# Patient Record
Sex: Female | Born: 1960
Health system: Southern US, Community
[De-identification: ages and names within clinical notes are randomized; demographics above are authoritative.]

## PROBLEM LIST (undated history)

## (undated) DIAGNOSIS — K219 Gastro-esophageal reflux disease without esophagitis: Secondary | ICD-10-CM

## (undated) DIAGNOSIS — E785 Hyperlipidemia, unspecified: Secondary | ICD-10-CM

## (undated) HISTORY — DX: Hyperlipidemia, unspecified: E78.5

## (undated) HISTORY — DX: Gastro-esophageal reflux disease without esophagitis: K21.9

---

## 2009-10-12 ENCOUNTER — Ambulatory Visit: Payer: Self-pay | Admitting: Diagnostic Radiology

## 2009-10-12 ENCOUNTER — Emergency Department (HOSPITAL_BASED_OUTPATIENT_CLINIC_OR_DEPARTMENT_OTHER): Admission: EM | Admit: 2009-10-12 | Discharge: 2009-10-12 | Payer: Self-pay | Admitting: Emergency Medicine

## 2009-10-17 DIAGNOSIS — R079 Chest pain, unspecified: Secondary | ICD-10-CM | POA: Insufficient documentation

## 2009-10-17 DIAGNOSIS — K219 Gastro-esophageal reflux disease without esophagitis: Secondary | ICD-10-CM

## 2009-10-17 DIAGNOSIS — E785 Hyperlipidemia, unspecified: Secondary | ICD-10-CM

## 2009-10-18 ENCOUNTER — Encounter: Payer: Self-pay | Admitting: Cardiology

## 2009-10-18 ENCOUNTER — Ambulatory Visit: Payer: Self-pay | Admitting: Cardiology

## 2009-11-10 ENCOUNTER — Ambulatory Visit: Payer: Self-pay | Admitting: Family

## 2009-11-10 DIAGNOSIS — N63 Unspecified lump in unspecified breast: Secondary | ICD-10-CM

## 2009-11-17 ENCOUNTER — Encounter: Admission: RE | Admit: 2009-11-17 | Discharge: 2009-11-17 | Payer: Self-pay | Admitting: Internal Medicine

## 2010-01-26 ENCOUNTER — Ambulatory Visit: Payer: Self-pay | Admitting: Diagnostic Radiology

## 2010-01-26 ENCOUNTER — Emergency Department (HOSPITAL_BASED_OUTPATIENT_CLINIC_OR_DEPARTMENT_OTHER)
Admission: EM | Admit: 2010-01-26 | Discharge: 2010-01-26 | Payer: Self-pay | Source: Home / Self Care | Admitting: Emergency Medicine

## 2010-05-08 NOTE — Assessment & Plan Note (Signed)
Summary: [facility]   Visit Type:  Initial Consult  CC:  chest pain.  History of Present Illness: 50 year old female for evaluation of chest pain. No prior cardiac history. Seen in the emergency room in early July with chest pain felt most likely to be musculoskeletal. One set of cardiac markers were normal. Hemoglobin, liver functions and chest x-ray normal. Electrocardiogram revealed a normal sinus rhythm with no ST changes. Patient states that she has had intermittent chest pain for 5 years. It can occur with exertion, certain movements or after eating. It radiates to her back. There is no associated nausea or diaphoresis but there is shortness of breath. It lasts all day at times. She was given pain medications and Protonix in the emergency room and these do help her pain. She otherwise denies dyspnea on exertion, orthopnea or increased pedal edema. Because of the above we were asked to further evaluate.  Preventive Screening-Counseling & Management  Alcohol-Tobacco     Smoking Status: never  Current Medications (verified): 1)  Ibuprofen 800 Mg Tabs (Ibuprofen) .... As Needed 2)  Amoxicillin 500 Mg Caps (Amoxicillin) .... Take 1 Cap Every 6 Hours Until Gone  Allergies (verified): No Known Drug Allergies  Past History:  Past Medical History: HYPERLIPIDEMIA  GERD  Past Surgical History: C section  Family History: Reviewed history and no changes required. No premature CAD  Social History: Reviewed history and no changes required. Full Time Married  Tobacco Use - No.  Alcohol Use - no 5 children Smoking Status:  never  Review of Systems       no fevers or chills, productive cough, hemoptysis, dysphasia, odynophagia, melena, hematochezia, dysuria, hematuria, rash, seizure activity, orthopnea, PND, pedal edema, claudication. Remaining systems are negative.   Vital Signs:  Patient profile:   50 year old female Height:      66 inches Weight:      207.50  pounds BMI:     33.61 Pulse rate:   68 / minute Pulse rhythm:   regular Resp:     18 per minute BP sitting:   100 / 60  (left arm) Cuff size:   large  Vitals Entered By: Vikki Ports (October 18, 2009 9:48 AM)  Physical Exam  General:  Well developed/well nourished in NAD Skin warm/dry Patient not depressed No peripheral clubbing Back-normal HEENT-normal/normal eyelids Neck supple/normal carotid upstroke bilaterally; no bruits; no JVD; no thyromegaly chest - CTA/ normal expansion CV - RRR/normal S1 and S2; no murmurs, rubs or gallops;  PMI nondisplaced Abdomen -NT/ND, no HSM, no mass, + bowel sounds, no bruit 2+ femoral pulses, no bruits Ext-trace edema, chords, 2+ DP Neuro-grossly nonfocal     EKG  Procedure date:  10/18/2009  Findings:      Normal sinus rhythm at a rate of 64. No ST changes noted.  Impression & Recommendations:  Problem # 1:  CHEST PAIN (ICD-786.50) Symptoms extremely atypical. Electrocardiogram normal. Chest pain reproduced with palpation. Most likely musculoskeletal. No further cardiac workup indicated. I've asked her to followup with a primary care physician.  Problem # 2:  HYPERLIPIDEMIA (ICD-272.4) Management per primary care.  Problem # 3:  GERD (ICD-530.81) Management per primary care.

## 2010-05-08 NOTE — Assessment & Plan Note (Signed)
Summary: 3169--Rm 4   Vital Signs:  Patient profile:   50 year old female Height:      64.5 inches Weight:      205.75 pounds BMI:     34.90 Temp:     98.0 degrees F oral Pulse rate:   66 / minute Pulse rhythm:   regular Resp:     18 per minute BP sitting:   112 / 76  (right arm) Cuff size:   large  Vitals Entered By: Mervin Kung CMA Duncan Dull) (November 10, 2009 9:07 AM) Is Patient Diabetic? No Comments Pt states she has taken Pantoprazole in the past but it didn't seem to help.  Has tried Hydrocodone but it made her dizzy.  Nicki Guadalajara Fergerson CMA (AAMA)  November 10, 2009 9:15 AM    History of Present Illness: Erica Lindsey is a 50 year old female originally from [place].  Notes that she has lived here for 7 years.  Her primary problem today is chest pain.  She saw Dr. Jens Som last month for atypical chest pain- he did not feel that her pain was cardiac and he recommended that she establish with a PCP.  Patient's history is limited due to language barrier- English is limited.  Patient reports that this pain has been going on for many years.  Pain is made worse by palpation/walking. Pain has been improved by nexium.    Sometimes feels like she can't take a deep breath.  She also notes + back pain.  She has been followed most recently by the [facility] in [facility]- but now has health insurance. She has also been followed at the [facility] in [place].    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   Married  Tobacco Use - No.  Alcohol Use - no 5 children Caffeine use/day:  none Does Patient Exercise:  no  Review of Systems       Constitutional: Denies Fever ENT:  Denies nasal congestion or sore throat. Resp: Denies cough CV:  see HPI GI:  Denies nausea or vomitting GU: Denies dysuria Lymphatic: Denies lymphadenopathy Musculoskeletal:  notes + flat feet- hurt at times.   Skin:  Denies Rashes Psychiatric: Denies depression Neuro: Denies numbness     Physical Exam  General:  Well-developed,well-nourished,in no acute distress; alert,appropriate and cooperative throughout examination Head:  Normocephalic and atraumatic without obvious abnormalities. No apparent alopecia or balding. Breasts:  + approximately 1" diameter mobile mass noted on left breast at 11 oclock.  Tender to palpation.   Lungs:  Normal respiratory effort, chest expands symmetrically. Lungs are clear to auscultation, no crackles or wheezes. Heart:  Normal rate and regular rhythm. S1 and S2 normal without gallop, murmur, click, rub or other extra sounds. Extremities:  No clubbing, cyanosis, edema, or deformity noted with normal full range of motion of all joints.     Impression & Recommendations:  Problem # 1:  BREAST MASS, LEFT (ICD-611.72) Assessment New Suspect that this is the etiology of her chest tenderenss.  ?  breast cyst.  Will refer for diagnostic mammo and breast ultrasound. Orders: Ultrasound (Ultrasound) Mammogram (Diagnostic) (Mammo)  Problem # 2:  GERD (ICD-530.81) Assessment: Improved Improved, continue nexium. Her updated medication list for this problem includes:    Nexium 40 Mg Cpdr (Esomeprazole magnesium) .Marland Kitchen... Take 1 capsule by mouth once a day  Complete Medication List: 1)  Nexium 40 Mg Cpdr (Esomeprazole magnesium) .... Take 1 capsule by mouth once a day 2)  Crestor 20 Mg Tabs (Rosuvastatin calcium) .... Take 1 tablet by mouth once a day 3)  Tylenol Extra  Strength 500 Mg Tabs (Acetaminophen) .... 2 tabs by mouth every 6 hours as needed for breast pain  Patient Instructions: 1)  Please arrange a follow up appointment for a complete physical- Come fasting to this appointment- nothing to eat or drink. 2)  Complete your breast ultrasound and mammogram downstairs on the first floor (imaging department). We will call you with your results. Prescriptions: CRESTOR 20 MG TABS (ROSUVASTATIN CALCIUM) Take 1 tablet by mouth once a day  #30 x 2   Entered and Authorized by:   Lemont Fillers FNP   Signed by:   Lemont Fillers FNP on 11/10/2009   Method used:   Electronically to        [facility] # [phone]* (retail)       [address]       [place], [place]  [address]       Ph: [phone]       Fax: [phone]   RxID:   [id number] NEXIUM 40 MG CPDR (ESOMEPRAZOLE MAGNESIUM) Take 1 capsule by mouth once a day  #30 x 5   Entered and Authorized by:   Lemont Fillers FNP   Signed by:   Lemont Fillers FNP on 11/10/2009   Method used:   Electronically to        [facility] # [phone]* (retail)       [address]       [place], [place]  [address]       Ph: [phone]       Fax: [phone]   RxID:   [id number]   Current Allergies (reviewed today): No known allergies        Vital Signs:  Patient Profile:   50 year old female Height:     64.5 inches Weight:      205.75 pounds BMI:     34.90 Temp:     98.0 degrees F oral Pulse rate:   66 / minute Pulse rhythm:   regular Resp:     18 per minute BP sitting:   112 / 76 Cuff size:   large

## 2010-06-20 LAB — CBC
HCT: 38.3 % (ref 36.0–46.0)
Hemoglobin: 12.6 g/dL (ref 12.0–15.0)
MCH: 28.9 pg (ref 26.0–34.0)
MCHC: 32.9 g/dL (ref 30.0–36.0)
MCV: 87.9 fL (ref 78.0–100.0)

## 2010-06-20 LAB — DIFFERENTIAL
Eosinophils Absolute: 0.2 10*3/uL (ref 0.0–0.7)
Lymphs Abs: 3.1 10*3/uL (ref 0.7–4.0)
Neutrophils Relative %: 53 % (ref 43–77)

## 2010-06-20 LAB — COMPREHENSIVE METABOLIC PANEL
ALT: 18 U/L (ref 0–35)
CO2: 30 mEq/L (ref 19–32)
Calcium: 9.7 mg/dL (ref 8.4–10.5)
Creatinine, Ser: 0.7 mg/dL (ref 0.4–1.2)
GFR calc non Af Amer: >60 mL/min (ref >60)
Glucose, Bld: 88 mg/dL (ref 70–99)
Sodium: 145 mEq/L (ref 135–145)

## 2010-06-20 LAB — POCT CARDIAC MARKERS
CKMB, poc: <1 ng/mL — ABNORMAL LOW (ref 1.0–8.0)
CKMB, poc: <1 ng/mL — ABNORMAL LOW (ref 1.0–8.0)
Myoglobin, poc: 35.6 ng/mL (ref 12–200)
Troponin i, poc: <0.05 ng/mL (ref 0.00–0.09)

## 2010-06-20 LAB — URINALYSIS, ROUTINE W REFLEX MICROSCOPIC
Bilirubin Urine: NEGATIVE
Ketones, ur: NEGATIVE mg/dL
Nitrite: NEGATIVE
Protein, ur: NEGATIVE mg/dL
Specific Gravity, Urine: 1.011 (ref 1.005–1.030)
Urobilinogen, UA: 0.2 mg/dL (ref 0.0–1.0)

## 2010-06-20 LAB — LIPASE, BLOOD: Lipase: 101 U/L (ref 23–300)

## 2010-06-20 LAB — PREGNANCY, URINE: Preg Test, Ur: NEGATIVE

## 2010-06-24 LAB — COMPREHENSIVE METABOLIC PANEL
AST: 30 U/L (ref 0–37)
Albumin: 4.5 g/dL (ref 3.5–5.2)
Calcium: 9.1 mg/dL (ref 8.4–10.5)
Creatinine, Ser: 0.7 mg/dL (ref 0.4–1.2)
GFR calc Af Amer: >60 mL/min (ref >60)
GFR calc non Af Amer: >60 mL/min (ref >60)

## 2010-06-24 LAB — DIFFERENTIAL
Eosinophils Relative: 2 % (ref 0–5)
Lymphocytes Relative: 45 % (ref 12–46)
Lymphs Abs: 3.5 10*3/uL (ref 0.7–4.0)
Monocytes Absolute: 0.4 10*3/uL (ref 0.1–1.0)
Neutro Abs: 3.7 10*3/uL (ref 1.7–7.7)

## 2010-06-24 LAB — CBC
MCH: 28.8 pg (ref 26.0–34.0)
MCHC: 33.4 g/dL (ref 30.0–36.0)
Platelets: 192 10*3/uL (ref 150–400)

## 2010-06-24 LAB — POCT CARDIAC MARKERS
CKMB, poc: <1 ng/mL — ABNORMAL LOW (ref 1.0–8.0)
Troponin i, poc: <0.05 ng/mL (ref 0.00–0.09)

## 2010-11-19 ENCOUNTER — Encounter: Payer: Self-pay | Admitting: Cardiology

## 2011-11-07 IMAGING — CR DG CHEST 2V
2 series · 2 of 2 positions shown · non-contrast
Comparison: 10/12/2009

CLINICAL DATA: Chest pain

CHEST - 2 VIEW

[w chest pa]
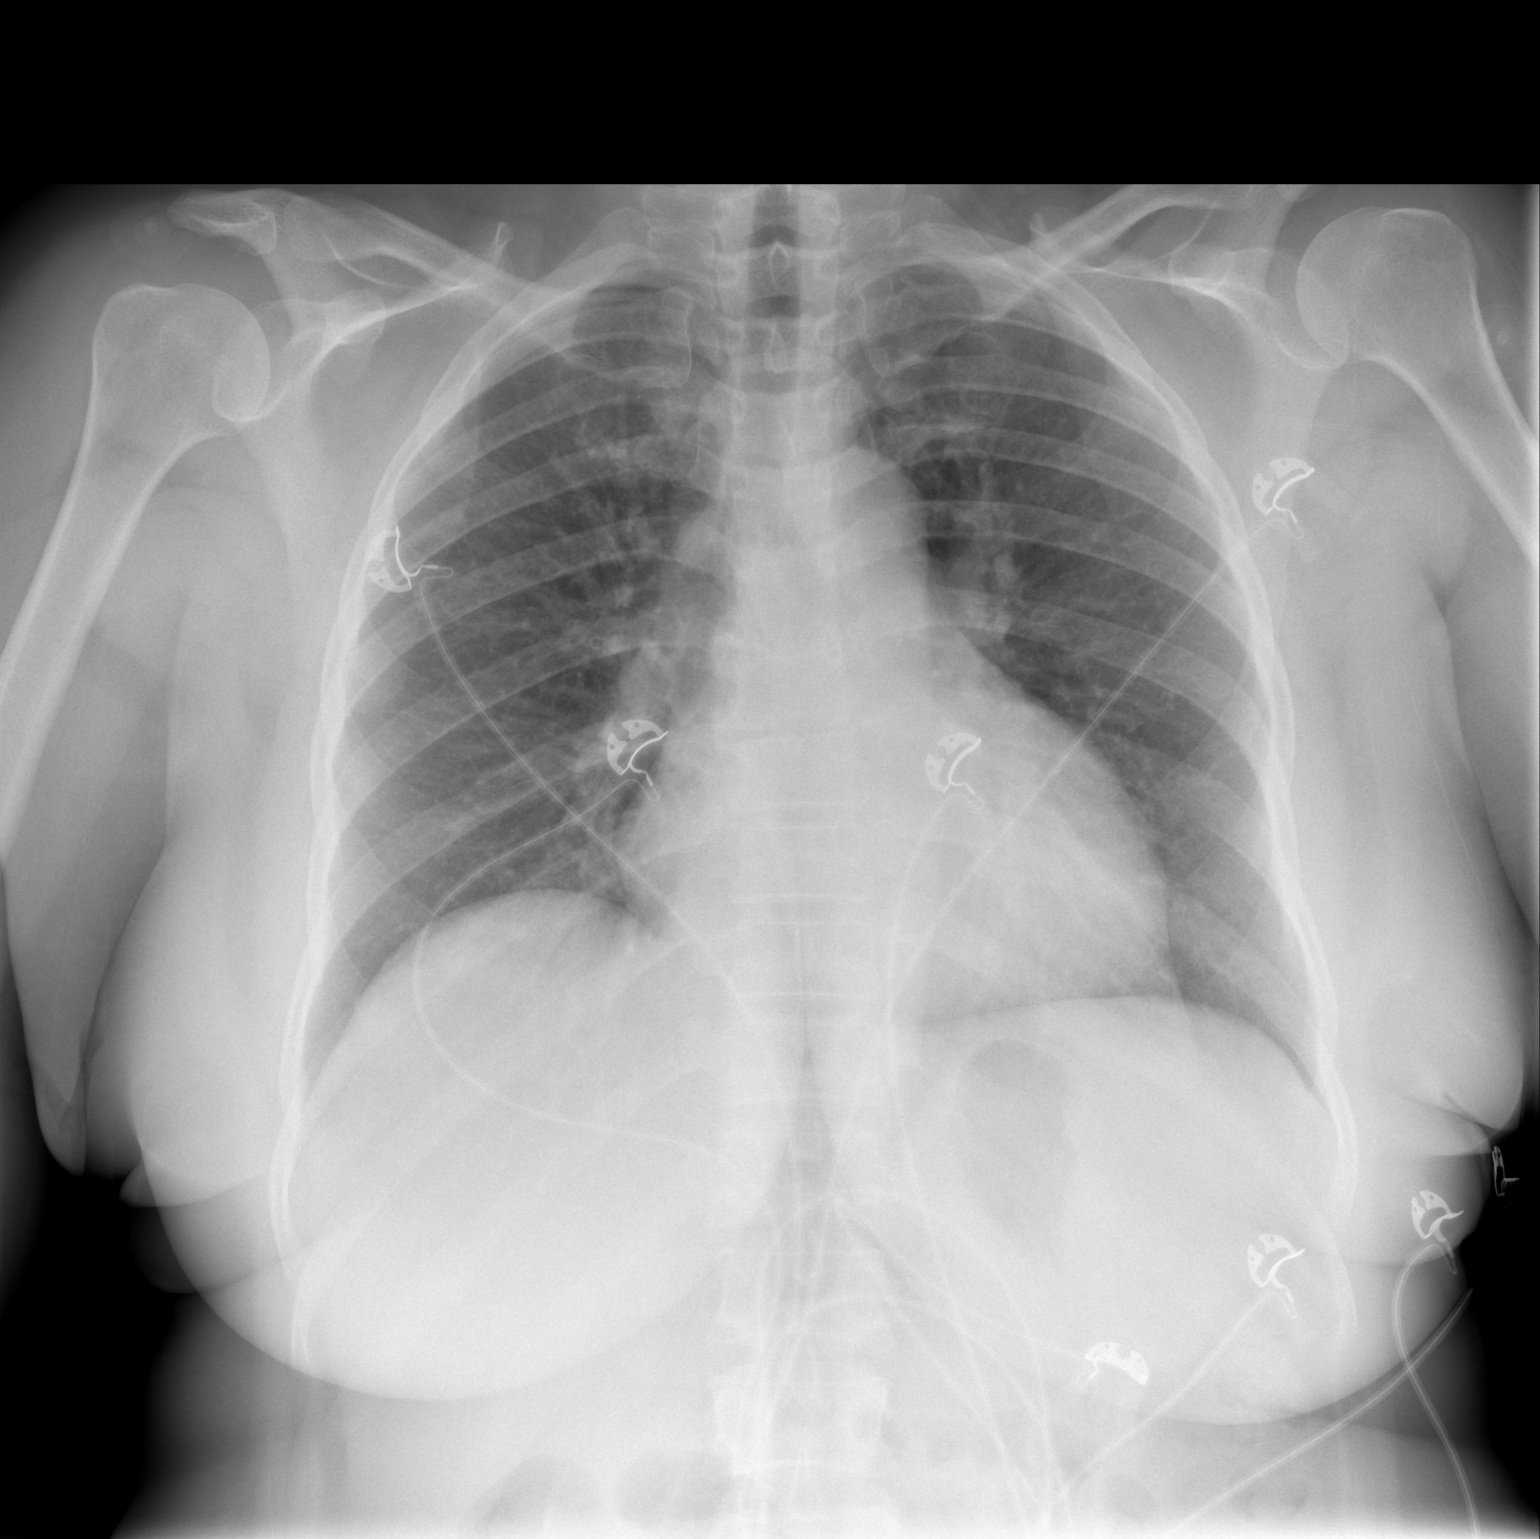

[w chest lat]
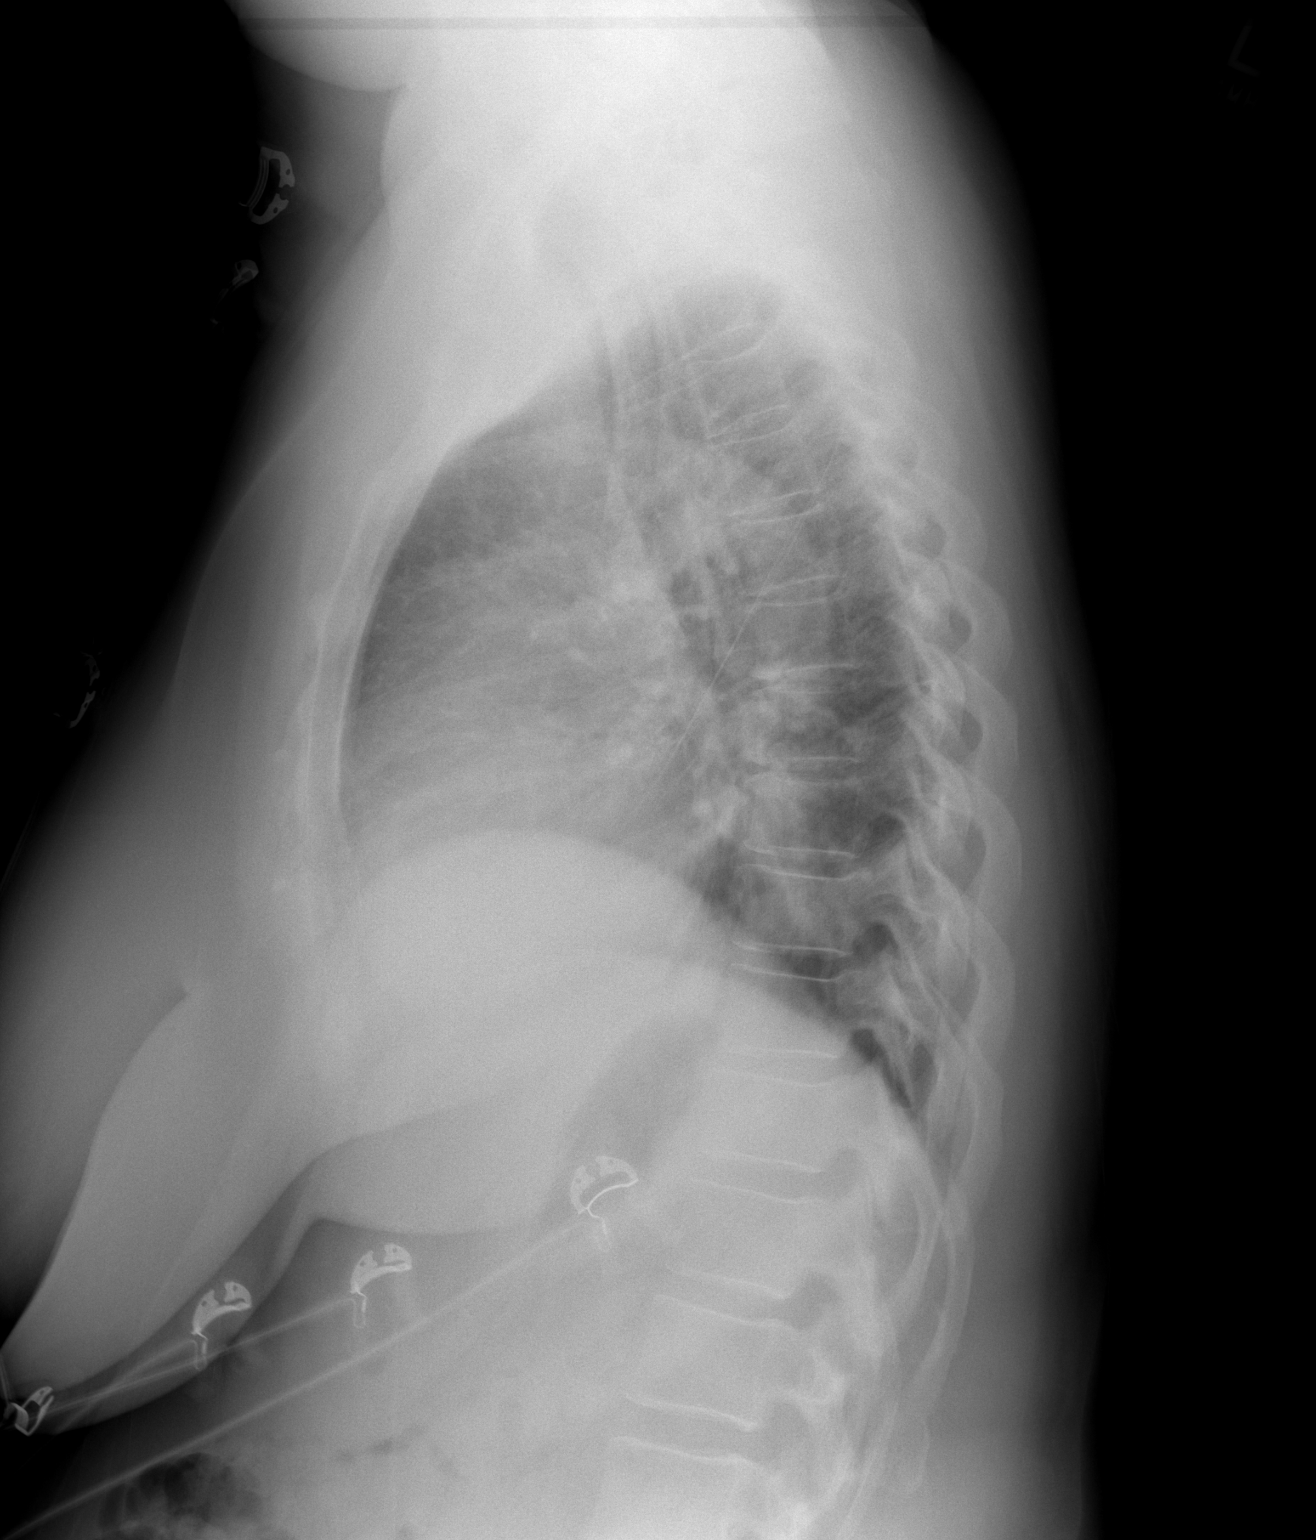

[2 of 2 positions shown; findings below may reference images not displayed]

FINDINGS: The lungs are low volume but clear bilaterally.  No
confluent airspace opacities, pleural effuions or pneumothoracies
are seen.  The heart is normal in size and contour.  The upper
abdomen and osseous structures are normal.
IMPRESSION: No acute cardiopulmonary disease.

## 2020-06-18 ENCOUNTER — Encounter (HOSPITAL_BASED_OUTPATIENT_CLINIC_OR_DEPARTMENT_OTHER): Payer: Self-pay

## 2020-06-18 ENCOUNTER — Emergency Department (HOSPITAL_BASED_OUTPATIENT_CLINIC_OR_DEPARTMENT_OTHER)
Admission: EM | Admit: 2020-06-18 | Discharge: 2020-06-18 | Disposition: A | Discharge status: Home / Self Care | Payer: 59 | Attending: Emergency Medicine | Admitting: Emergency Medicine

## 2020-06-18 ENCOUNTER — Emergency Department (HOSPITAL_BASED_OUTPATIENT_CLINIC_OR_DEPARTMENT_OTHER): Payer: 59

## 2020-06-18 ENCOUNTER — Other Ambulatory Visit: Payer: Self-pay

## 2020-06-18 DIAGNOSIS — X500XXA Overexertion from strenuous movement or load, initial encounter: Secondary | ICD-10-CM | POA: Diagnosis not present

## 2020-06-18 DIAGNOSIS — M25511 Pain in right shoulder: Secondary | ICD-10-CM | POA: Insufficient documentation

## 2020-06-18 MED ORDER — OXYCODONE-ACETAMINOPHEN 5-325 MG PO TABS: 1.0000 | ORAL_TABLET | Freq: Three times a day (TID) | ORAL | 0 refills | Status: DC | PRN

## 2020-06-18 MED ORDER — HYDROCODONE-ACETAMINOPHEN 5-325 MG PO TABS
1.0000 | ORAL_TABLET | Freq: Once | ORAL | Status: AC
  Administered 2020-06-18: 1 via ORAL
  Filled 2020-06-18: qty 1

## 2020-06-18 NOTE — ED Provider Notes (Signed)
[facility] EMERGENCY DEPARTMENT Provider Note   CSN: [id number] Arrival date & time: 06/18/20  1027     History Chief Complaint  Patient presents with  . Shoulder Pain    Erica Lindsey is a 60 y.o. female.  HPI Patient is a 60 year old female who presents the emergency department due to right shoulder pain.  Her symptoms started about 2 weeks ago.  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  She states she woke up in the morning and was experiencing her symptoms.  Denies any known injury or falls.  No numbness or tingling.  She does report some mild weakness secondary to her pain.  No other complaints at this time.    Past Medical History:  Diagnosis Date  . GERD (gastroesophageal reflux disease)   . HLD (hyperlipidemia)     Patient Active Problem List   Diagnosis Date Noted  . BREAST MASS, LEFT 11/10/2009  . HYPERLIPIDEMIA 10/17/2009  . GERD 10/17/2009  . CHEST PAIN 10/17/2009    Past Surgical History:  Procedure Laterality Date  . BLADDER REPAIR W/ CESAREAN SECTION       OB History   No obstetric history on file.     Family History  Problem Relation Age of Onset  . Hypertension Mother   . Diabetes Mother   . Stroke Father   . Heart attack Father     Social History   Tobacco Use  . Smoking status: Never Smoker  . Smokeless tobacco: Never Used  . Tobacco comment: tobacco use- no   Substance Use Topics  . Alcohol use: No  . Drug use: Never    Home Medications Prior to Admission medications   Medication Sig Start Date End Date Taking? Authorizing Provider  oxyCODONE-acetaminophen (PERCOCET/ROXICET) 5-325 MG tablet Take 1 tablet by mouth every 8 (eight) hours as needed for severe pain. 06/18/20  Yes Placido Sou, PA-C  acetaminophen (TYLENOL) 500 MG tablet Take 1,000 mg by mouth every 6 (six) hours as needed.      [provider]  esomeprazole (NEXIUM) 40 MG capsule Take 40 mg by mouth daily.       [provider]  rosuvastatin (CRESTOR) 20 MG tablet Take 20 mg by mouth daily.      [provider]    Allergies    Patient has no known allergies.  Review of Systems   Review of Systems  Gastrointestinal: Negative for abdominal pain.  Musculoskeletal: Positive for arthralgias and myalgias. Negative for joint swelling.  Skin: Negative for color change and wound.  Neurological: Positive for weakness. Negative for numbness.   Physical Exam Updated Vital Signs BP 134/82 (BP Location: Right Arm)   Pulse 70   Temp 98.3 F (36.8 C) (Oral)   Resp 16   Ht 5\' 4"  (1.626 m)   Wt 87.5 kg   SpO2 99%   BMI 33.13 kg/m   Physical Exam Vitals and nursing note reviewed.  Constitutional:      General: She is not in acute distress.    Appearance: She is well-developed.  HENT:     Head: Normocephalic and atraumatic.     Right Ear: External ear normal.     Left Ear: External ear normal.  Eyes:     General: No scleral icterus.       Right eye: No discharge.        Left eye: No discharge.     Conjunctiva/sclera: Conjunctivae normal.  Neck:  Trachea: No tracheal deviation.  Cardiovascular:     Rate and Rhythm: Normal rate.  Pulmonary:     Effort: Pulmonary effort is normal. No respiratory distress.     Breath sounds: No stridor.  Abdominal:     General: Abdomen is flat. There is no distension.     Palpations: Abdomen is soft.     Tenderness: There is no abdominal tenderness.  Musculoskeletal:        General: Tenderness present. No swelling or deformity.     Cervical back: Neck supple.     Comments: Moderate tenderness noted circumferentially around the right shoulder.  Difficult to assess patient's range of motion secondary to pain.  She does have full passive range of motion.  Negative Hawkins sign.  Positive cross body abduction test with the right arm.  Strength is 5 out of 5 in both upper extremities.  Grip strength is intact.  Distal sensation is intact.  2+  radial pulses.  Skin:    General: Skin is warm and dry.     Findings: No rash.  Neurological:     Mental Status: She is alert.     Cranial Nerves: Cranial nerve deficit: no gross deficits.     ED Results / Procedures / Treatments   Labs (all labs ordered are listed, but only abnormal results are displayed) Labs Reviewed - No data to display  EKG None  Radiology DG Shoulder Right  Result Date: 06/18/2020 CLINICAL DATA:  Right shoulder pain.  No known injury. EXAM: RIGHT SHOULDER - 2+ VIEW COMPARISON:  None. FINDINGS: No fracture dislocation. No degenerative changes. No acute abnormalities identified. IMPRESSION: Negative. Electronically Signed   By: Gerome Sam III M.D   On: 06/18/2020 11:14   Procedures Procedures   Medications Ordered in ED Medications  HYDROcodone-acetaminophen (NORCO/VICODIN) 5-325 MG per tablet 1 tablet (1 tablet Oral Given 06/18/20 1058)    ED Course  I have reviewed the triage vital signs and the nursing notes.  Pertinent labs & imaging results that were available during my care of the patient were reviewed by me and considered in my medical decision making (see chart for details).  Clinical Course as of 06/18/20 1131  Sun Jun 18, 2020  1120 DG Shoulder Right Negative. [LJ]    Clinical Course User Index [LJ] Jannet Mantis   MDM Rules/Calculators/A&P                          Patient is a 60 year old female who presents the emergency department due to right shoulder pain.  Started about 2 weeks ago.  Patient does a significant amount of heavy lifting through her job.  Physical exam is generally reassuring.  She does have moderate tenderness circumferentially around the right shoulder but range of motion and strength appears to be intact.  She had a negative Hawkins sign but a positive cross body abduction test.  Symptoms seem to be muscular in nature.  No focal bony tenderness.  She is neurovascularly intact in the upper extremities.  No  red flags.  She was given a dose of Vicodin in the emergency department and notes significant relief of her pain.  Her husband is going to drive her home.  Will discharge on a short course of Percocet.  We discussed safety regarding this medication.  She was given sports medicine follow-up.  Discussed return precautions.  Her questions were answered and she was amicable at the time of discharge.  Final Clinical Impression(s) / ED Diagnoses Final diagnoses:  Acute pain of right shoulder   Rx / DC Orders ED Discharge Orders         Ordered    oxyCODONE-acetaminophen (PERCOCET/ROXICET) 5-325 MG tablet  Every 8 hours PRN        06/18/20 1126           Placido Sou, PA-C 06/18/20 1131    [place], MD 06/21/20 [phone]

## 2020-06-18 NOTE — Discharge Instructions (Signed)
Like we discussed, I am going to prescribe you a strong medication called Percocet.  I would recommend you continue taking Tylenol during the day for your regular pain.  If you have severe breakthrough pain, you can then take 1 Percocet.  Only take this as prescribed.  Do not mix with alcohol.  Do not operate a motor vehicle after taking it.  Percocet also contains Tylenol, so please be sure that you are not exceeding 3000 mg of Tylenol in 1 day.  Below is the contact information for Dr. Jordan Likes.  He has a local sports medicine doctor.  His office is located in the same building as this emergency department.  Please give him a call and schedule a follow-up appointment.  If your symptoms worsen, you can always return to the emergency department.  It was a pleasure to meet you.

## 2020-06-18 NOTE — ED Notes (Signed)
Patient transported to X-ray 

## 2020-06-18 NOTE — ED Triage Notes (Signed)
Pt arrive with right sided shoulder pain and arm pain reports she thinks it may be from lifting something heavy, pt has been using tylenol at home but reports this only works for about 2 hours.

## 2020-06-18 NOTE — ED Notes (Signed)
Pt presents with 2 weeks right shoulder pain. Pt lifts furniture during work day. Pt is very sensitive to being touched right shoulder . Pt has limited ROM due to pain. Pt s arm is able to be lifted and ROM by MD. Pulses are moderate, skin is appropriate for ethnicity. Si Gaul

## 2020-06-19 ENCOUNTER — Encounter (HOSPITAL_BASED_OUTPATIENT_CLINIC_OR_DEPARTMENT_OTHER): Payer: Self-pay

## 2020-06-29 ENCOUNTER — Ambulatory Visit (INDEPENDENT_AMBULATORY_CARE_PROVIDER_SITE_OTHER): Payer: 59 | Admitting: Family Medicine

## 2020-06-29 ENCOUNTER — Ambulatory Visit: Payer: Self-pay

## 2020-06-29 ENCOUNTER — Other Ambulatory Visit: Payer: Self-pay

## 2020-06-29 VITALS — BP 120/76 | Ht 64.0 in | Wt 193.0 lb

## 2020-06-29 DIAGNOSIS — M25411 Effusion, right shoulder: Secondary | ICD-10-CM

## 2020-06-29 DIAGNOSIS — M1711 Unilateral primary osteoarthritis, right knee: Secondary | ICD-10-CM | POA: Insufficient documentation

## 2020-06-29 DIAGNOSIS — M25511 Pain in right shoulder: Secondary | ICD-10-CM

## 2020-06-29 DIAGNOSIS — S43439A Superior glenoid labrum lesion of unspecified shoulder, initial encounter: Secondary | ICD-10-CM | POA: Insufficient documentation

## 2020-06-29 MED ORDER — PREDNISONE 5 MG PO TABS: ORAL_TABLET | ORAL | 0 refills | Status: DC

## 2020-06-29 NOTE — Patient Instructions (Signed)
Nice to meet you Please try the medicine  Please try the exercises   Please send me a message in MyChart with any questions or updates.  Please see me back in 3 weeks.   --Dr. Jordan Likes

## 2020-06-29 NOTE — Assessment & Plan Note (Signed)
There appears to be an effusion within the joint as the source of most of her pain.  Does have findings suggestive of impingement as well. -Counseled on home exercise therapy and supportive care. -Prednisone. -Could consider injection or physical therapy.

## 2020-06-29 NOTE — Progress Notes (Signed)
  Erica Lindsey - 60 y.o. female MRN [id number]  Date of birth: 1961/03/06  SUBJECTIVE:  Including CC & ROS.  No chief complaint on file.   Erica Lindsey is a 60 y.o. female that is presenting with right shoulder pain.  Pain has been ongoing for the past 3 weeks.  Denies any injury or inciting event.  Has tried over-the-counter medications with limited improvement.  No prior history of surgery..  Independent review of the right shoulder x-ray from 2/13 shows no acute changes.  Interview was conducted with the telephone [place] interpreter  Review of Systems See HPI   HISTORY: Past Medical, Surgical, Social, and Family History Reviewed & Updated per EMR.   Pertinent Historical Findings include:  Past Medical History:  Diagnosis Date  . GERD (gastroesophageal reflux disease)   . HLD (hyperlipidemia)     Past Surgical History:  Procedure Laterality Date  . BLADDER REPAIR W/ CESAREAN SECTION      Family History  Problem Relation Age of Onset  . Hypertension Mother   . Diabetes Mother   . Stroke Father   . Heart attack Father     Social History   Socioeconomic History  . Marital status: Married    Spouse name: Not on file  . Number of children: Not on file  . Years of education: Not on file  . Highest education level: Not on file  Occupational History  . Not on file  Tobacco Use  . Smoking status: Never Smoker  . Smokeless tobacco: Never Used  . Tobacco comment: tobacco use- no   Substance and Sexual Activity  . Alcohol use: No  . Drug use: Never  . Sexual activity: Not on file  Other Topics Concern  . Not on file  Social History Narrative   ** Merged History Encounter **       Social Determinants of Health   Financial Resource Strain: Not on file  Food Insecurity: Not on file  Transportation Needs: Not on file  Physical Activity: Not on file  Stress: Not on file  Social Connections: Not on file  Intimate Partner Violence: Not on file      PHYSICAL EXAM:  VS: BP 120/76 (BP Location: Left Arm, Patient Position: Sitting, Cuff Size: Large)   Ht 5\' 4"  (1.626 m)   Wt 193 lb (87.5 kg)   BMI 33.13 kg/m  Physical Exam Gen: NAD, alert, cooperative with exam, well-appearing MSK:  Right shoulder: Normal range of motion. Normal strength resistance. Positive empty can test. Neurovascular intact  Limited ultrasound: Right shoulder: There is an encircling effusion of the biceps tendon. No changes of the subscapularis. Does have hyperemia at the acromial tip.  Supraspinatus appears to be normal. Effusion noted in the posterior glenohumeral joint.:  Summary: Effusion appears to be in the joint.  Ultrasound and interpretation by , MD    ASSESSMENT & PLAN:   Effusion of joint of right shoulder There appears to be an effusion within the joint as the source of most of her pain.  Does have findings suggestive of impingement as well. -Counseled on home exercise therapy and supportive care. -Prednisone. -Could consider injection or physical therapy.  Primary osteoarthritis of right knee Has had previous imaging demonstrating mild medial joint space narrowing.  Has received injections in the past. -Counseled on home exercise therapy and supportive care. -Prednisone. -Could consider further imaging, physical therapy or gel injections.

## 2020-06-29 NOTE — Assessment & Plan Note (Signed)
Has had previous imaging demonstrating mild medial joint space narrowing.  Has received injections in the past. -Counseled on home exercise therapy and supportive care. -Prednisone. -Could consider further imaging, physical therapy or gel injections.

## 2020-07-20 ENCOUNTER — Ambulatory Visit (INDEPENDENT_AMBULATORY_CARE_PROVIDER_SITE_OTHER): Payer: 59 | Admitting: Family Medicine

## 2020-07-20 ENCOUNTER — Encounter: Payer: Self-pay | Admitting: Family Medicine

## 2020-07-20 ENCOUNTER — Other Ambulatory Visit: Payer: Self-pay

## 2020-07-20 ENCOUNTER — Ambulatory Visit: Payer: Self-pay

## 2020-07-20 DIAGNOSIS — M25411 Effusion, right shoulder: Secondary | ICD-10-CM

## 2020-07-20 MED ORDER — FLUTICASONE PROPIONATE 50 MCG/ACT NA SUSP: 2.0000 | Freq: Every day | NASAL | 3 refills | Status: AC

## 2020-07-20 MED ORDER — CETIRIZINE HCL 10 MG PO TABS: 10.0000 mg | ORAL_TABLET | Freq: Every day | ORAL | 11 refills | Status: AC

## 2020-07-20 MED ORDER — TRIAMCINOLONE ACETONIDE 40 MG/ML IJ SUSP
40.0000 mg | Freq: Once | INTRAMUSCULAR | Status: AC
  Administered 2020-07-20: 40 mg via INTRA_ARTICULAR

## 2020-07-20 NOTE — Progress Notes (Signed)
Erica Lindsey - 60 y.o. female MRN [id number]  Date of birth: 07/17/1960  SUBJECTIVE:  Including CC & ROS.  No chief complaint on file.   Erica Lindsey is a 60 y.o. female that is  Following up for her right shoulder pain. Pain is still occurring. Localized to the shoulder.    Review of Systems See HPI   HISTORY: Past Medical, Surgical, Social, and Family History Reviewed & Updated per EMR.   Pertinent Historical Findings include:  Past Medical History:  Diagnosis Date  . GERD (gastroesophageal reflux disease)   . HLD (hyperlipidemia)     Past Surgical History:  Procedure Laterality Date  . BLADDER REPAIR W/ CESAREAN SECTION      Family History  Problem Relation Age of Onset  . Hypertension Mother   . Diabetes Mother   . Stroke Father   . Heart attack Father     Social History   Socioeconomic History  . Marital status: Married    Spouse name: Not on file  . Number of children: Not on file  . Years of education: Not on file  . Highest education level: Not on file  Occupational History  . Not on file  Tobacco Use  . Smoking status: Never Smoker  . Smokeless tobacco: Never Used  . Tobacco comment: tobacco use- no   Substance and Sexual Activity  . Alcohol use: No  . Drug use: Never  . Sexual activity: Not on file  Other Topics Concern  . Not on file  Social History Narrative   ** Merged History Encounter **       Social Determinants of Health   Financial Resource Strain: Not on file  Food Insecurity: Not on file  Transportation Needs: Not on file  Physical Activity: Not on file  Stress: Not on file  Social Connections: Not on file  Intimate Partner Violence: Not on file     PHYSICAL EXAM:  VS: BP 120/82 (BP Location: Left Arm, Patient Position: Sitting, Cuff Size: Large)   Ht 5\' 4"  (1.626 m)   Wt 193 lb (87.5 kg)   BMI 33.13 kg/m  Physical Exam Gen: NAD, alert, cooperative with exam, well-appearing MSK:  Right shoulder:  Normal  range of motion of the Normal strength resistance Family external rotation. Pain with empty can testing. Neurovascularly intact   Aspiration/Injection Procedure Note Erica Lindsey 15-Feb-1961  Procedure: Injection Indications: Right shoulder pain   Procedure Details Consent: Risks of procedure as well as the alternatives and risks of each were explained to the (patient/caregiver).  Consent for procedure obtained. Time Out: Verified patient identification, verified procedure, site/side was marked, verified correct patient position, special equipment/implants available, medications/allergies/relevent history reviewed, required imaging and test results available.  Performed.  The area was cleaned with iodine and alcohol swabs.    The right glenohumeral joint was injected using 3 cc of 1% lidocaine on a 22-gauge 3-1/2 inch needle.  The syringe was switched to mixture containing 1 cc's of 40 mg Kenalog and 4 cc's of 0.25% bupivacaine was injected.  Ultrasound was used. Images were obtained in long views showing the injection.     A sterile dressing was applied.  Patient did tolerate procedure well.     ASSESSMENT & PLAN:   Effusion of joint of right shoulder Acute worsening.  Did get mild improvement with the prednisone but the pain has returned.  Concern for possible labral tear given the duration and findings on ultrasound. -Counseled  on home exercise therapy and supportive care. -Injection. -Could consider physical therapy for further imaging.

## 2020-07-20 NOTE — Assessment & Plan Note (Addendum)
Acute worsening.  Did get mild improvement with the prednisone but the pain has returned.  Concern for possible labral tear given the duration and findings on ultrasound. -Counseled on home exercise therapy and supportive care. -Injection. -Could consider physical therapy for further imaging.

## 2020-07-20 NOTE — Patient Instructions (Signed)
Good to see you Please try ice  Please continue the exercises   Please send me a message in MyChart with any questions or updates.  Please see me back in 4 weeks.   --Dr. Zigmond Trela  

## 2020-07-27 ENCOUNTER — Ambulatory Visit (INDEPENDENT_AMBULATORY_CARE_PROVIDER_SITE_OTHER): Payer: 59 | Admitting: Family Medicine

## 2020-07-27 ENCOUNTER — Other Ambulatory Visit: Payer: Self-pay

## 2020-07-27 DIAGNOSIS — S43431D Superior glenoid labrum lesion of right shoulder, subsequent encounter: Secondary | ICD-10-CM

## 2020-07-27 MED ORDER — HYDROCODONE-ACETAMINOPHEN 5-325 MG PO TABS: 1.0000 | ORAL_TABLET | Freq: Three times a day (TID) | ORAL | 0 refills | Status: DC | PRN

## 2020-07-27 MED ORDER — MELOXICAM 7.5 MG PO TABS: 7.5000 mg | ORAL_TABLET | Freq: Two times a day (BID) | ORAL | 1 refills | Status: DC | PRN

## 2020-07-27 NOTE — Progress Notes (Signed)
  Erica Lindsey - 60 y.o. female MRN [id number]  Date of birth: 1961/01/28  SUBJECTIVE:  Including CC & ROS.  No chief complaint on file.   Erica Lindsey is a 60 y.o. female that is presenting with worsening of her right shoulder pain.  The pain has been ongoing since around January.  She does work for herself.  She is having pain with any abduction or flexion.  She has tried medications, home therapy for over 6 weeks and cortisone injection.   Review of Systems See HPI   HISTORY: Past Medical, Surgical, Social, and Family History Reviewed & Updated per EMR.   Pertinent Historical Findings include:  Past Medical History:  Diagnosis Date  . GERD (gastroesophageal reflux disease)   . HLD (hyperlipidemia)     Past Surgical History:  Procedure Laterality Date  . BLADDER REPAIR W/ CESAREAN SECTION      Family History  Problem Relation Age of Onset  . Hypertension Mother   . Diabetes Mother   . Stroke Father   . Heart attack Father     Social History   Socioeconomic History  . Marital status: Married    Spouse name: Not on file  . Number of children: Not on file  . Years of education: Not on file  . Highest education level: Not on file  Occupational History  . Not on file  Tobacco Use  . Smoking status: Never Smoker  . Smokeless tobacco: Never Used  . Tobacco comment: tobacco use- no   Substance and Sexual Activity  . Alcohol use: No  . Drug use: Never  . Sexual activity: Not on file  Other Topics Concern  . Not on file  Social History Narrative   ** Merged History Encounter **       Social Determinants of Health   Financial Resource Strain: Not on file  Food Insecurity: Not on file  Transportation Needs: Not on file  Physical Activity: Not on file  Stress: Not on file  Social Connections: Not on file  Intimate Partner Violence: Not on file     PHYSICAL EXAM:  VS: BP 120/70   Ht 5\' 4"  (1.626 m)   Wt 210 lb (95.3 kg)   BMI 36.05 kg/m   Physical Exam Gen: NAD, alert, cooperative with exam, well-appearing MSK:  Right shoulder: Normal range of motion. Normal strength resistance. Normal empty can test. Positive speeds test. Positive O'Brien's test. Neurovascular intact     ASSESSMENT & PLAN:   Labral tear of shoulder Acutely worsening.  Concern for labral tear given effusion seen on previous ultrasound of the bicipital sheath. -Counseled on home exercise therapy and supportive care. -Mobic. -Norco. -Referral to physical therapy. -MRI to evaluate for labral tear

## 2020-07-27 NOTE — Patient Instructions (Signed)
Good to see you Please try ice as needed  Please try physical therapy  Please call [phone] to schedule the mri   Please take the pain medication (norco) as needed. Please take with a fiber supplement.  Please send me a message in MyChart with any questions or updates.  We will setup a virtual visit once the MRI is resulted.   --Dr. Jordan Likes

## 2020-07-27 NOTE — Assessment & Plan Note (Addendum)
Acutely worsening.  Concern for labral tear given effusion seen on previous ultrasound of the bicipital sheath. -Counseled on home exercise therapy and supportive care. -Mobic. -Norco. -Referral to physical therapy. -MRI to evaluate for labral tear

## 2020-08-01 ENCOUNTER — Encounter: Payer: Self-pay | Admitting: Physical Therapy

## 2020-08-01 ENCOUNTER — Other Ambulatory Visit: Payer: Self-pay

## 2020-08-01 ENCOUNTER — Ambulatory Visit: Payer: 59 | Attending: Family Medicine | Admitting: Physical Therapy

## 2020-08-01 DIAGNOSIS — M25611 Stiffness of right shoulder, not elsewhere classified: Secondary | ICD-10-CM | POA: Diagnosis present

## 2020-08-01 DIAGNOSIS — M25511 Pain in right shoulder: Secondary | ICD-10-CM | POA: Insufficient documentation

## 2020-08-01 DIAGNOSIS — R29898 Other symptoms and signs involving the musculoskeletal system: Secondary | ICD-10-CM | POA: Insufficient documentation

## 2020-08-01 DIAGNOSIS — M6281 Muscle weakness (generalized): Secondary | ICD-10-CM | POA: Insufficient documentation

## 2020-08-01 NOTE — Therapy (Signed)
[facility] [address]  [address] [place], [place], [address] Phone: [phone]   Fax:  [phone]  Physical Therapy Treatment  Patient Details  Name: Erica Lindsey MRN: [id number] Date of Birth: 1960-08-08 Referring Provider (PT): Clare Gandy, MD   Encounter Date: 08/01/2020   PT End of Session - 08/01/20 0846    Visit Number 1    Number of Visits 13    Date for PT Re-Evaluation 09/12/20    Authorization Type [employer]    PT Start Time 0800    PT Stop Time 0839    PT Time Calculation (min) 39 min    Activity Tolerance Patient limited by pain    Behavior During Therapy [facility] for tasks assessed/performed           Past Medical History:  Diagnosis Date  . GERD (gastroesophageal reflux disease)   . HLD (hyperlipidemia)     Past Surgical History:  Procedure Laterality Date  . BLADDER REPAIR W/ CESAREAN SECTION      There were no vitals filed for this visit.   Subjective Assessment - 08/01/20 0803    Subjective Patient reports R shoulder pain for the past 2 months. Denies specific injury. Pain is located over the anterior R shoulder and down the lateral arm. Reports pain after trying to do her hair and reports crunching in the front of her shoulder with certain movements. Notes particular difficulty with lifting overhead and rotating shoulder into ER. Having trouble sleeping. Has tried muscle creams without benefit. Better with hydrocodone and meloxicam. Reports intermittent N/T down the hand but denies neck pain.    Pertinent History HLD, GERD    Limitations Lifting;House hold activities    Diagnostic tests MRI ordered    Patient Stated Goals decrease pain    Currently in Pain? Yes    Pain Score 8     Pain Location Shoulder    Pain Orientation Right;Anterior    Pain Descriptors / Indicators Sharp    Pain Type Acute pain    Pain Radiating Towards lateral arm              [facility] PT Assessment -  08/01/20 0808      Assessment   Medical Diagnosis Tear of R glenoid labrum    Referring Provider (PT) Clare Gandy, MD    Onset Date/Surgical Date 06/03/20    Hand Dominance Right    Next MD Visit 08/17/20    Prior Therapy yes      Precautions   Precautions None      Balance Screen   Has the patient fallen in the past 6 months No    Has the patient had a decrease in activity level because of a fear of falling?  No    Is the patient reluctant to leave their home because of a fear of falling?  No      Home Environment   Living Environment Private residence    Living Arrangements Spouse/significant other    Available Help at Discharge Family    Type of Home House      Prior Function   Level of Independence Independent    Vocation Self employed    Vocation Requirements sells furniture- lifting,, bending, walking    Leisure none      Cognition   Overall Cognitive Status Within Functional Limits for tasks assessed      Sensation   Light Touch Appears Intact  Posture/Postural Control   Posture/Postural Control Postural limitations    Postural Limitations Rounded Shoulders    Posture Comments L wt shift      ROM / Strength   AROM / PROM / Strength AROM;PROM;Strength      AROM   AROM Assessment Site Shoulder    Right/Left Shoulder Right;Left    Right Shoulder Flexion 100 Degrees   severe pain   Right Shoulder ABduction 32 Degrees   limited by pain   Right Shoulder Internal Rotation --   unable d/t pain   Right Shoulder External Rotation --   unable d/t pain   Left Shoulder Flexion 150 Degrees    Left Shoulder ABduction 155 Degrees    Left Shoulder Internal Rotation --   FIT T10   Left Shoulder External Rotation --   FER T3     PROM   PROM Assessment Site Shoulder    Right/Left Shoulder Right    Right Shoulder Flexion 135 Degrees   limited by pain   Right Shoulder ABduction 59 Degrees   limited by pain   Right Shoulder Internal Rotation 72 Degrees    Right  Shoulder External Rotation 10 Degrees   limited by pain     Strength   Strength Assessment Site Shoulder    Right/Left Shoulder Right;Left    Right Shoulder Flexion 4-/5    Right Shoulder ABduction 3+/5   in scaption   Right Shoulder Internal Rotation 2+/5   shoulder in neutral; pain   Right Shoulder External Rotation 2+/5   shoulder in neutral; pain   Left Shoulder Flexion 4+/5    Left Shoulder ABduction 4+/5    Left Shoulder Internal Rotation 4+/5    Left Shoulder External Rotation 4+/5      Palpation   Palpation comment TTP diffusely over R anterior shoulder                                 PT Education - 08/01/20 0844    Education Details prognosis, POC, HEP; advised to try ice or heat to shoulder for 10-15 minutes for pain relief    Person(s) Educated Patient    Methods Explanation;Tactile cues;Demonstration;Verbal cues;Handout    Comprehension Verbalized understanding;Returned demonstration            PT Short Term Goals - 08/01/20 0851      PT SHORT TERM GOAL #1   Title Patient to be independent with initial HEP.    Time 3    Period Weeks    Status New    Target Date 08/22/20             PT Long Term Goals - 08/01/20 5397      PT LONG TERM GOAL #1   Title Patient to be independent with advanced HEP.    Time 6    Period Weeks    Status New    Target Date 09/12/20      PT LONG TERM GOAL #2   Title Patient to demonstrate R shoulder strength >/=4+/5.    Time 6    Period Weeks    Status New    Target Date 09/12/20      PT LONG TERM GOAL #3   Title Patient to demonstrate R shoulder AROM and PROM WFL and with only mild pain remaining.    Time 6    Period Weeks    Status New    Target  Date 09/12/20      PT LONG TERM GOAL #4   Title Patient to report ability to do her hair without pain limiting.    Time 6    Period Weeks    Status New    Target Date 09/12/20      PT LONG TERM GOAL #5   Title Patient to report 80%  improvement in sleeping tolerance d/t improvement in pain.    Time 6    Period Weeks    Status New    Target Date 09/12/20                 Plan - 08/01/20 0847    Clinical Impression Statement Patient is a 60 y/o F presenting to OPPT with c/o acute R shoulder pain without inciting injury for the past 2 months. Pain is located over the R anterior shoulder with radiation down the lateral arm. Worse with trying to do her hair, lifting overhead, rotating into ER. Reports intermittent N/T down the hand but denies neck pain. Patient today presenting with rounded shoulders and L weight shift, quite limited and painful R shoulder AROM and PROM, decreased R shoulder strength, and diffuse TTP over R anterior shoulder. Patient was educated on gentle AAROM HEP- patient reported understanding. Would benefit from skilled PT services 2x/week for 6 weeks to address aforementioned impairments.    Personal Factors and Comorbidities Age;Comorbidity 2;Past/Current Experience;Profession;Time since onset of injury/illness/exacerbation    Comorbidities HLD, GERD    Examination-Activity Limitations Sleep;Transfers;Self Feeding;Reach Overhead;Lift;Hygiene/Grooming;Dressing;Carry;Bed Mobility    Examination-Participation Restrictions Cleaning;Community Activity;Driving;Laundry;Meal Prep;Occupation;Yard Work;Shop    Stability/Clinical Decision Making Stable/Uncomplicated    Clinical Decision Making Low    Rehab Potential Good    PT Frequency 2x / week    PT Duration 6 weeks    PT Treatment/Interventions ADLs/Self Care Home Management;Cryotherapy;Electrical Stimulation;Iontophoresis 4mg /ml Dexamethasone;Moist Heat;Therapeutic exercise;Therapeutic activities;Functional mobility training;Ultrasound;Neuromuscular re-education;Patient/family education;Vasopneumatic Device;Taping;Energy conservation;Dry needling;Passive range of motion    PT Next Visit Plan shoulder FOTO; reassess HEP; progress R shoulder PROM/AAROM/AROM  to tolerance    Consulted and Agree with Plan of Care Patient           Patient will benefit from skilled therapeutic intervention in order to improve the following deficits and impairments:  Hypomobility,Increased edema,Decreased activity tolerance,Decreased strength,Pain,Impaired UE functional use,Increased fascial restricitons,Increased muscle spasms,Improper body mechanics,Decreased range of motion,Impaired flexibility,Postural dysfunction  Visit Diagnosis: Acute pain of right shoulder  Stiffness of right shoulder, not elsewhere classified  Muscle weakness (generalized)  Other symptoms and signs involving the musculoskeletal system     Problem List Patient Active Problem List   Diagnosis Date Noted  . Labral tear of shoulder 06/29/2020  . Primary osteoarthritis of right knee 06/29/2020  . BREAST MASS, LEFT 11/10/2009  . HYPERLIPIDEMIA 10/17/2009  . GERD 10/17/2009  . CHEST PAIN 10/17/2009     12/18/2009, PT, DPT 08/01/20 8:59 AM   [facility] [address]  [address] [place], [place], [place] Phone: [phone]   Fax:  [phone]  Name: Erica Lindsey MRN: Corie Chiquito Date of Birth: [birth year]

## 2020-08-04 ENCOUNTER — Other Ambulatory Visit: Payer: Self-pay

## 2020-08-04 ENCOUNTER — Ambulatory Visit: Payer: 59 | Admitting: Physical Therapy

## 2020-08-04 ENCOUNTER — Encounter: Payer: Self-pay | Admitting: Physical Therapy

## 2020-08-04 DIAGNOSIS — M25611 Stiffness of right shoulder, not elsewhere classified: Secondary | ICD-10-CM

## 2020-08-04 DIAGNOSIS — M6281 Muscle weakness (generalized): Secondary | ICD-10-CM

## 2020-08-04 DIAGNOSIS — M25511 Pain in right shoulder: Secondary | ICD-10-CM | POA: Diagnosis not present

## 2020-08-04 DIAGNOSIS — R29898 Other symptoms and signs involving the musculoskeletal system: Secondary | ICD-10-CM

## 2020-08-04 NOTE — Therapy (Signed)
[facility] Out[facility] [address]  [address] [place], [place], [address] Phone: [phone]   Fax:  [phone]  Physical Therapy Treatment  Patient Details  Name: Erica Lindsey MRN: [id number] Date of Birth: 04-20-1960 Referring Provider (PT): Clare Gandy, MD   Encounter Date: 08/04/2020   PT End of Session - 08/04/20 1023    Visit Number 2    Number of Visits 13    Date for PT Re-Evaluation 09/12/20    Authorization Type [employer]    PT Start Time 0930    PT Stop Time 1029    PT Time Calculation (min) 59 min    Activity Tolerance Patient limited by pain;Patient tolerated treatment well    Behavior During Therapy [facility] for tasks assessed/performed           Past Medical History:  Diagnosis Date  . GERD (gastroesophageal reflux disease)   . HLD (hyperlipidemia)     Past Surgical History:  Procedure Laterality Date  . BLADDER REPAIR W/ CESAREAN SECTION      There were no vitals filed for this visit.   Subjective Assessment - 08/04/20 0930    Subjective Had a lot of pain last night. HEP is going okay- denies questions.    Pertinent History HLD, GERD    Diagnostic tests MRI ordered    Patient Stated Goals decrease pain    Currently in Pain? Yes    Pain Score 7     Pain Location Shoulder    Pain Orientation Right;Anterior    Pain Descriptors / Indicators Sharp    Pain Type Acute pain              [facility] PT Assessment - 08/04/20 0958      Observation/Other Assessments   Focus on Therapeutic Outcomes (FOTO)  Shoulder: 50                         [facility] Adult PT Treatment/Exercise - 08/04/20 0001      Exercises   Exercises Shoulder      Shoulder Exercises: Supine   External Rotation AAROM;10 reps    External Rotation Limitations AAROM with wand   initially c/o severe pain- improved with cues to decrease ROM   Flexion AAROM;10 reps    Flexion Limitations supine with wand   cues to  keep hands shoulder width apart   ABduction AAROM;Right;10 reps    ABduction Limitations with wand to tolerance   poor tolerance for ABD, slightly improved with limited ROM scaption     Shoulder Exercises: Seated   Other Seated Exercises B shoulder elevation + relaxation 5x3", B shoulder circles forward/back 10x each   focus on relaxing     Shoulder Exercises: Standing   Other Standing Exercises R shoulder pendulums 30" each CW, ant/pos, M/L   heavy cueing and demonstration to avoid AROM     Shoulder Exercises: Pulleys   Flexion 2 minutes    Flexion Limitations to tolerance    Scaption 2 minutes    Scaption Limitations to tolerance      Modalities   Modalities Electrical Stimulation;Moist Heat      Moist Heat Therapy   Number Minutes Moist Heat 10 Minutes    Moist Heat Location Shoulder   R     Electrical Stimulation   Electrical Stimulation Location R shoulder complex    Electrical Stimulation Action IFC    Electrical Stimulation Parameters ouput to  tolerance; 10 min    Electrical Stimulation Goals Pain                  PT Education - 08/04/20 1022    Education Details edu on appropriate levels of pain/discomfort with ther-ex and to stay below "severe" pain threshhold    Person(s) Educated Patient    Methods Explanation;Demonstration;Tactile cues;Verbal cues    Comprehension Verbalized understanding;Returned demonstration            PT Short Term Goals - 08/04/20 1033      PT SHORT TERM GOAL #1   Title Patient to be independent with initial HEP.    Time 3    Period Weeks    Status On-going    Target Date 08/22/20             PT Long Term Goals - 08/04/20 1033      PT LONG TERM GOAL #1   Title Patient to be independent with advanced HEP.    Time 6    Period Weeks    Status On-going      PT LONG TERM GOAL #2   Title Patient to demonstrate R shoulder strength >/=4+/5.    Time 6    Period Weeks    Status On-going      PT LONG TERM GOAL #3    Title Patient to demonstrate R shoulder AROM and PROM WFL and with only mild pain remaining.    Time 6    Period Weeks    Status On-going      PT LONG TERM GOAL #4   Title Patient to report ability to do her hair without pain limiting.    Time 6    Period Weeks    Status On-going      PT LONG TERM GOAL #5   Title Patient to report 80% improvement in sleeping tolerance d/t improvement in pain.    Time 6    Period Weeks    Status On-going                 Plan - 08/04/20 1023    Clinical Impression Statement Patient arrived to session with report of increased night pain last night. Reports no issues with HEP. Initiated pulleys for ROM with patient tolerating flexion well, more discomfort with scaption. Patient with considerable difficulty correcting muscle guarding with pendulums, thus worked on gentle shoulder circles and shrugs to encourage relaxation. Reviewed shoulder AAROM with wand with patient remarking on improved flexion ROM with subsequent reps. Did demonstrate poor tolerance for abduction AAROM, slightly improved with limited ROM scaption. Patient also with c/o "greater than 10/10 pain" with shoulder ER AAROM. Discussed appropriate pain levels with exercises and encouraged patient to increase communication with therapist to improve tolerance for ther-ex- patient reported understanding. Ended session with moist heat and e-stim to R shoulder for pain relief. Patient reported good benefit and without complaints at end of session.    Comorbidities HLD, GERD    PT Treatment/Interventions ADLs/Self Care Home Management;Cryotherapy;Electrical Stimulation;Iontophoresis 4mg /ml Dexamethasone;Moist Heat;Therapeutic exercise;Therapeutic activities;Functional mobility training;Ultrasound;Neuromuscular re-education;Patient/family education;Vasopneumatic Device;Taping;Energy conservation;Dry needling;Passive range of motion    PT Next Visit Plan reassess HEP; progress R shoulder  PROM/AAROM/AROM to tolerance    Consulted and Agree with Plan of Care Patient           Patient will benefit from skilled therapeutic intervention in order to improve the following deficits and impairments:  Hypomobility,Increased edema,Decreased activity tolerance,Decreased strength,Pain,Impaired UE functional use,Increased fascial restricitons,Increased  muscle spasms,Improper body mechanics,Decreased range of motion,Impaired flexibility,Postural dysfunction  Visit Diagnosis: Acute pain of right shoulder  Stiffness of right shoulder, not elsewhere classified  Muscle weakness (generalized)  Other symptoms and signs involving the musculoskeletal system     Problem List Patient Active Problem List   Diagnosis Date Noted  . Labral tear of shoulder 06/29/2020  . Primary osteoarthritis of right knee 06/29/2020  . BREAST MASS, LEFT 11/10/2009  . HYPERLIPIDEMIA 10/17/2009  . GERD 10/17/2009  . CHEST PAIN 10/17/2009     Anette Guarneri, PT, DPT 08/04/20 10:35 AM   [facility] [address]  [address] [place], [place], [address] Phone: [phone]   Fax:  [phone]  Name: Erica Lindsey MRN: [id number] Date of Birth: [birth year]

## 2020-08-07 ENCOUNTER — Ambulatory Visit: Payer: 59 | Attending: Family Medicine

## 2020-08-07 ENCOUNTER — Other Ambulatory Visit: Payer: Self-pay

## 2020-08-07 DIAGNOSIS — R29898 Other symptoms and signs involving the musculoskeletal system: Secondary | ICD-10-CM

## 2020-08-07 DIAGNOSIS — M25511 Pain in right shoulder: Secondary | ICD-10-CM

## 2020-08-07 DIAGNOSIS — M6281 Muscle weakness (generalized): Secondary | ICD-10-CM

## 2020-08-07 DIAGNOSIS — M25611 Stiffness of right shoulder, not elsewhere classified: Secondary | ICD-10-CM

## 2020-08-07 NOTE — Therapy (Signed)
[facility] [address]  [address] [place], [place], [address] Phone: [phone]   Fax:  [phone]  Physical Therapy Treatment  Patient Details  Name: Erica Lindsey MRN: [id number] Date of Birth: [birth year] Referring Provider (PT): Clare Gandy, MD   Encounter Date: 08/07/2020   PT End of Session - 08/07/20 0928    Visit Number 3    Number of Visits 13    Date for PT Re-Evaluation 09/12/20    Authorization Type [employer]    PT Start Time [phone]   pt late   PT Stop Time 0927    PT Time Calculation (min) 31 min    Activity Tolerance Patient limited by pain;Patient tolerated treatment well    Behavior During Therapy [facility] for tasks assessed/performed           Past Medical History:  Diagnosis Date  . GERD (gastroesophageal reflux disease)   . HLD (hyperlipidemia)     Past Surgical History:  Procedure Laterality Date  . BLADDER REPAIR W/ CESAREAN SECTION      There were no vitals filed for this visit.   Subjective Assessment - 08/07/20 0857    Subjective Pt reports that she is doing better but still reports pain and stiffness in her R shoulder.    Pertinent History HLD, GERD    Diagnostic tests MRI ordered    Patient Stated Goals decrease pain    Currently in Pain? Yes    Pain Score 8     Pain Location Shoulder    Pain Orientation Right;Anterior    Pain Descriptors / Indicators Constant;Sharp    Pain Type Acute pain                             [facility] Adult PT Treatment/Exercise - 08/07/20 0001      Exercises   Exercises Shoulder      Shoulder Exercises: Supine   Flexion AAROM;10 reps;Both    Flexion Limitations supine with wand; 10 add reps with hands clasped together    ABduction AAROM;Right;10 reps    ABduction Limitations with wand      Shoulder Exercises: Pulleys   Flexion 2 minutes    Flexion Limitations to tolerance    Scaption 2 minutes    Scaption Limitations to  tolerance      Manual Therapy   Manual Therapy Passive ROM    Passive ROM gentle into all planes; light distraction                    PT Short Term Goals - 08/04/20 1033      PT SHORT TERM GOAL #1   Title Patient to be independent with initial HEP.    Time 3    Period Weeks    Status On-going    Target Date 08/22/20             PT Long Term Goals - 08/04/20 1033      PT LONG TERM GOAL #1   Title Patient to be independent with advanced HEP.    Time 6    Period Weeks    Status On-going      PT LONG TERM GOAL #2   Title Patient to demonstrate R shoulder strength >/=4+/5.    Time 6    Period Weeks    Status On-going      PT LONG TERM GOAL #3  Title Patient to demonstrate R shoulder AROM and PROM WFL and with only mild pain remaining.    Time 6    Period Weeks    Status On-going      PT LONG TERM GOAL #4   Title Patient to report ability to do her hair without pain limiting.    Time 6    Period Weeks    Status On-going      PT LONG TERM GOAL #5   Title Patient to report 80% improvement in sleeping tolerance d/t improvement in pain.    Time 6    Period Weeks    Status On-going                 Plan - 08/07/20 3009    Clinical Impression Statement Pt arrived late to session. Had reports of pain mostly with shoulder abduction. She responded well with MT, showing increased painfree ROM. She required some cueing to properly perform cane exercises isolating the correct motions. Session was shortened d/t pt arriving late. She also noted tightness and discomfort under the axilla, advised her to speak with MD about this. Pt responded well.    Personal Factors and Comorbidities Age;Comorbidity 2;Past/Current Experience;Profession;Time since onset of injury/illness/exacerbation    Comorbidities HLD, GERD    PT Frequency 2x / week    PT Duration 6 weeks    PT Treatment/Interventions ADLs/Self Care Home Management;Cryotherapy;Electrical  Stimulation;Iontophoresis 4mg /ml Dexamethasone;Moist Heat;Therapeutic exercise;Therapeutic activities;Functional mobility training;Ultrasound;Neuromuscular re-education;Patient/family education;Vasopneumatic Device;Taping;Energy conservation;Dry needling;Passive range of motion    PT Next Visit Plan reassess HEP; progress R shoulder PROM/AAROM/AROM to tolerance    Consulted and Agree with Plan of Care Patient           Patient will benefit from skilled therapeutic intervention in order to improve the following deficits and impairments:  Hypomobility,Increased edema,Decreased activity tolerance,Decreased strength,Pain,Impaired UE functional use,Increased fascial restricitons,Increased muscle spasms,Improper body mechanics,Decreased range of motion,Impaired flexibility,Postural dysfunction  Visit Diagnosis: Acute pain of right shoulder  Stiffness of right shoulder, not elsewhere classified  Muscle weakness (generalized)  Other symptoms and signs involving the musculoskeletal system     Problem List Patient Active Problem List   Diagnosis Date Noted  . Labral tear of shoulder 06/29/2020  . Primary osteoarthritis of right knee 06/29/2020  . BREAST MASS, LEFT 11/10/2009  . HYPERLIPIDEMIA 10/17/2009  . GERD 10/17/2009  . CHEST PAIN 10/17/2009    12/18/2009, PTA 08/07/2020, 10:59 AM  [facility] [address]  [address] [place], [place], [place] Phone: [phone]   Fax:  [phone]  Name: Erica Lindsey MRN: Corie Chiquito Date of Birth: [birth year]

## 2020-08-09 ENCOUNTER — Ambulatory Visit: Payer: 59 | Admitting: Physical Therapy

## 2020-08-09 ENCOUNTER — Encounter: Payer: Self-pay | Admitting: Physical Therapy

## 2020-08-09 ENCOUNTER — Other Ambulatory Visit: Payer: Self-pay

## 2020-08-09 DIAGNOSIS — M25511 Pain in right shoulder: Secondary | ICD-10-CM

## 2020-08-09 DIAGNOSIS — M6281 Muscle weakness (generalized): Secondary | ICD-10-CM

## 2020-08-09 DIAGNOSIS — R29898 Other symptoms and signs involving the musculoskeletal system: Secondary | ICD-10-CM

## 2020-08-09 DIAGNOSIS — M25611 Stiffness of right shoulder, not elsewhere classified: Secondary | ICD-10-CM

## 2020-08-09 NOTE — Therapy (Signed)
[facility] [address]  [address] [place], [place], [address] Phone: [phone]   Fax:  [phone]  Physical Therapy Treatment  Patient Details  Name: Erica Lindsey MRN: [id number] Date of Birth: 07/19/1960 Referring Provider (PT): Clare Gandy, MD   Encounter Date: 08/09/2020   PT End of Session - 08/09/20 0936    Visit Number 4    Number of Visits 13    Date for PT Re-Evaluation 09/12/20    Authorization Type [employer]    PT Start Time [phone]    PT Stop Time 0931    PT Time Calculation (min) 42 min    Activity Tolerance Patient limited by pain    Behavior During Therapy [facility] for tasks assessed/performed           Past Medical History:  Diagnosis Date  . GERD (gastroesophageal reflux disease)   . HLD (hyperlipidemia)     Past Surgical History:  Procedure Laterality Date  . BLADDER REPAIR W/ CESAREAN SECTION      There were no vitals filed for this visit.   Subjective Assessment - 08/09/20 0849    Subjective R shoulder is feeling worse. Reports compliance with HEP and denies pushing into pain. E-stim did not seem to help much. MRI is scheduled for this sunday. Notes that she is still groggy from taking her pain meds on Monday night.    Pertinent History HLD, GERD    Diagnostic tests MRI ordered    Patient Stated Goals decrease pain    Currently in Pain? Yes    Pain Score 8     Pain Location Shoulder    Pain Orientation Right;Anterior    Pain Descriptors / Indicators Constant;Sharp    Pain Type Acute pain                             [facility] Adult PT Treatment/Exercise - 08/09/20 0001      Shoulder Exercises: Seated   External Rotation AAROM;Right;10 reps    External Rotation Limitations with wand and towel roll under elbow   cued to limit ROM d/t pain   Other Seated Exercises R shoulder pball AAROM rollouts flexion 10x3", scaption 10x3"   to tolerance     Shoulder Exercises:  Standing   Extension Strengthening;Left;10 reps;Theraband    Theraband Level (Shoulder Extension) Level 1 (Yellow)    Extension Limitations cues to correct form    Row Strengthening;Both;10 reps;Theraband    Theraband Level (Shoulder Row) Level 1 (Yellow)    Row Limitations heavy manual and verbal cues to depress and retract scapulae without over-retraction      Shoulder Exercises: Pulleys   Flexion 3 minutes    Flexion Limitations to tolerance- cueing to avoid pushing into pain    Scaption 3 minutes   discontinued 30 sec early d/t pain   Scaption Limitations to tolerance- cueing to avoid pushing into pain      Shoulder Exercises: Isometric Strengthening   Flexion 5X5"    Flexion Limitations towel roll under elbow, 50% effort    Extension 5X5"    Extension Limitations towel roll under elbow, 50% effort      Modalities   Modalities Iontophoresis      Iontophoresis   Type of Iontophoresis Dexamethasone    Location R anterolateral shoulder    Dose 1.28ml, 58mA*minutes    Time 4 hour patch  PT Education - 08/09/20 0933    Education Details edu on ionto benefits, wear time, removal, precautions; advised patient not to drive while taking narcotics    Person(s) Educated Patient    Methods Explanation;Demonstration;Tactile cues;Verbal cues;Handout    Comprehension Verbalized understanding;Returned demonstration            PT Short Term Goals - 08/04/20 1033      PT SHORT TERM GOAL #1   Title Patient to be independent with initial HEP.    Time 3    Period Weeks    Status On-going    Target Date 08/22/20             PT Long Term Goals - 08/04/20 1033      PT LONG TERM GOAL #1   Title Patient to be independent with advanced HEP.    Time 6    Period Weeks    Status On-going      PT LONG TERM GOAL #2   Title Patient to demonstrate R shoulder strength >/=4+/5.    Time 6    Period Weeks    Status On-going      PT LONG TERM GOAL #3   Title  Patient to demonstrate R shoulder AROM and PROM WFL and with only mild pain remaining.    Time 6    Period Weeks    Status On-going      PT LONG TERM GOAL #4   Title Patient to report ability to do her hair without pain limiting.    Time 6    Period Weeks    Status On-going      PT LONG TERM GOAL #5   Title Patient to report 80% improvement in sleeping tolerance d/t improvement in pain.    Time 6    Period Weeks    Status On-going                 Plan - 08/09/20 0936    Clinical Impression Statement Patient arrived to session with report of worsening pain in the R shoulder. Reports compliance with HEP and denies pushing into pain with her exercises. R shoulder MRI is scheduled for Sunday. Performed gentle AAROM with pulleys with patient again tolerating flexion better than scaption despite cueing to avoid pushing into painful ROM. Initiated gentle AAROM with physioball with slightly improved tolerance. Initiated gentle periscapular strengthening with heavy manual and verbal cues to depress and retract scapulae without over-retraction. Patient did demonstrate relatively good tolerance for these activities. Patient was educated on ionto wear time, removal, precautions, thus placed ionto patch to R anterolateral shoulder at end of session. Patient without further complaints at end of session.    Personal Factors and Comorbidities Age;Comorbidity 2;Past/Current Experience;Profession;Time since onset of injury/illness/exacerbation    Comorbidities HLD, GERD    PT Frequency 2x / week    PT Duration 6 weeks    PT Treatment/Interventions ADLs/Self Care Home Management;Cryotherapy;Electrical Stimulation;Iontophoresis 4mg /ml Dexamethasone;Moist Heat;Therapeutic exercise;Therapeutic activities;Functional mobility training;Ultrasound;Neuromuscular re-education;Patient/family education;Vasopneumatic Device;Taping;Energy conservation;Dry needling;Passive range of motion    PT Next Visit Plan  reassess HEP; progress R shoulder PROM/AAROM/AROM to tolerance    Consulted and Agree with Plan of Care Patient           Patient will benefit from skilled therapeutic intervention in order to improve the following deficits and impairments:  Hypomobility,Increased edema,Decreased activity tolerance,Decreased strength,Pain,Impaired UE functional use,Increased fascial restricitons,Increased muscle spasms,Improper body mechanics,Decreased range of motion,Impaired flexibility,Postural dysfunction  Visit Diagnosis: Acute pain of right  shoulder  Stiffness of right shoulder, not elsewhere classified  Muscle weakness (generalized)  Other symptoms and signs involving the musculoskeletal system     Problem List Patient Active Problem List   Diagnosis Date Noted  . Labral tear of shoulder 06/29/2020  . Primary osteoarthritis of right knee 06/29/2020  . BREAST MASS, LEFT 11/10/2009  . HYPERLIPIDEMIA 10/17/2009  . GERD 10/17/2009  . CHEST PAIN 10/17/2009     Anette Guarneri, PT, DPT 08/09/20 9:54 AM   [facility] [address]  [address] [place], [place], [address] Phone: [phone]   Fax:  [phone]  Name: Erica Lindsey MRN: [id number] Date of Birth: 1960/07/26

## 2020-08-09 NOTE — Patient Instructions (Signed)
    IONTOPHORESIS PATIENT PRECAUTIONS & CONTRAINDICATIONS:  Redness under one or both electrodes can occur.  This characterized by a uniform redness that usually disappears within 12 hours of treatment. Small pinhead size blisters may result in response to the drug.  Contact your physician if the problem persists more than 24 hours. On rare occasions, iontophoresis therapy can result in temporary skin reactions such as rash, inflammation, irritation or burns.  The skin reactions may be the result of individual sensitivity to the ionic solution used, the condition of the skin at the start of treatment, reaction to the materials in the electrodes, allergies or sensitivity to dexamethasone, or a poor connection between the patch and your skin.  Discontinue using iontophoresis if you have any of these reactions and report to your therapist. Remove the Patch or electrodes if you have any undue sensation of pain or burning during the treatment and report discomfort to your therapist. Tell your Therapist if you have had known adverse reactions to the application of electrical current. Approximate treatment time is 4-6 hours.  Remove the patch after 6 hours. The Patch can be worn during normal activity, however excessive motion where the electrodes have been placed can cause poor contact between the skin and the electrode or uneven electrical current resulting in greater risk of skin irritation. Keep out of the reach of children.   DO NOT use if you have a cardiac pacemaker or any other electrically sensitive implanted device. DO NOT use if you have a known sensitivity to dexamethasone. DO NOT use during Magnetic Resonance Imaging (MRI). DO NOT use over broken or compromised skin (e.g. sunburn, cuts, or acne) due to the increased risk of skin reaction. DO NOT SHAVE over the area to be treated:  To establish good contact between the Patch and the skin, excessive hair may be clipped. DO NOT place the  Patch or electrodes on or over your eyes, directly over your heart, or brain. DO NOT reuse the Patch or electrodes as this may cause burns to occur.   For questions, please contact your therapist at:  [facility] [place] [address]  [address] [place], [place], [address] Phone: [phone]   Fax:  [phone]  

## 2020-08-13 ENCOUNTER — Ambulatory Visit
Admission: RE | Admit: 2020-08-13 | Discharge: 2020-08-13 | Disposition: A | Discharge status: Home / Self Care | Payer: 59 | Source: Ambulatory Visit | Attending: Family Medicine | Admitting: Family Medicine

## 2020-08-13 ENCOUNTER — Other Ambulatory Visit: Payer: Self-pay

## 2020-08-13 DIAGNOSIS — S43431D Superior glenoid labrum lesion of right shoulder, subsequent encounter: Secondary | ICD-10-CM

## 2020-08-15 ENCOUNTER — Other Ambulatory Visit: Payer: Self-pay

## 2020-08-15 ENCOUNTER — Ambulatory Visit: Payer: 59

## 2020-08-15 DIAGNOSIS — R29898 Other symptoms and signs involving the musculoskeletal system: Secondary | ICD-10-CM

## 2020-08-15 DIAGNOSIS — M25511 Pain in right shoulder: Secondary | ICD-10-CM | POA: Diagnosis not present

## 2020-08-15 DIAGNOSIS — M25611 Stiffness of right shoulder, not elsewhere classified: Secondary | ICD-10-CM

## 2020-08-15 DIAGNOSIS — M6281 Muscle weakness (generalized): Secondary | ICD-10-CM

## 2020-08-15 NOTE — Therapy (Signed)
[facility] [address]  [address] [place], [place], [address] Phone: [phone]   Fax:  [phone]  Physical Therapy Treatment  Patient Details  Name: Erica Lindsey MRN: [id number] Date of Birth: May 23, 1960 Referring Provider (PT): Clare Gandy, MD   Encounter Date: 08/15/2020   PT End of Session - 08/15/20 0956    Visit Number 5    Number of Visits 13    Date for PT Re-Evaluation 09/12/20    Authorization Type [employer]    PT Start Time [phone]    PT Stop Time 0933    PT Time Calculation (min) 46 min    Activity Tolerance Patient limited by pain    Behavior During Therapy [facility] for tasks assessed/performed           Past Medical History:  Diagnosis Date  . GERD (gastroesophageal reflux disease)   . HLD (hyperlipidemia)     Past Surgical History:  Procedure Laterality Date  . BLADDER REPAIR W/ CESAREAN SECTION      There were no vitals filed for this visit.   Subjective Assessment - 08/15/20 0850    Subjective Pt reports that she took her medicine earlier this morning, it took away the pain.    Pertinent History HLD, GERD    Diagnostic tests MRI ordered    Patient Stated Goals decrease pain    Currently in Pain? Yes    Pain Score 6     Pain Location Shoulder    Pain Orientation Right;Anterior    Pain Descriptors / Indicators Constant    Pain Type Acute pain                             [facility] Adult PT Treatment/Exercise - 08/15/20 0001      Shoulder Exercises: Seated   Flexion Right;10 reps;AROM      Shoulder Exercises: Standing   Extension Strengthening;Both;10 reps;Theraband    Theraband Level (Shoulder Extension) Level 2 (Red)    Row Strengthening;Both;10 reps;Theraband    Theraband Level (Shoulder Row) Level 2 (Red)    Row Limitations cueing to keep shld retracted and depressed    Other Standing Exercises R scap stab with beach ball on wall, circles both ways 2x10  reps      Shoulder Exercises: Pulleys   Flexion 3 minutes    Flexion Limitations to tolerance    Scaption 1 minute    Scaption Limitations to tolerance- c/o  pain      Shoulder Exercises: ROM/Strengthening   Wall Pushups 10 reps    Wall Pushups Limitations serratus push up 10 reps; heavy cueing for technique      Iontophoresis   Type of Iontophoresis Dexamethasone    Location R anterolateral shoulder    Dose 1.55ml, 28mA*minutes    Time 4 hour patch      Manual Therapy   Manual Therapy Passive ROM;Soft tissue mobilization    Soft tissue mobilization R anterolateral deltoid and shoulder    Passive ROM gentle into all planes                    PT Short Term Goals - 08/04/20 1033      PT SHORT TERM GOAL #1   Title Patient to be independent with initial HEP.    Time 3    Period Weeks    Status On-going    Target Date 08/22/20  PT Long Term Goals - 08/04/20 1033      PT LONG TERM GOAL #1   Title Patient to be independent with advanced HEP.    Time 6    Period Weeks    Status On-going      PT LONG TERM GOAL #2   Title Patient to demonstrate R shoulder strength >/=4+/5.    Time 6    Period Weeks    Status On-going      PT LONG TERM GOAL #3   Title Patient to demonstrate R shoulder AROM and PROM WFL and with only mild pain remaining.    Time 6    Period Weeks    Status On-going      PT LONG TERM GOAL #4   Title Patient to report ability to do her hair without pain limiting.    Time 6    Period Weeks    Status On-going      PT LONG TERM GOAL #5   Title Patient to report 80% improvement in sleeping tolerance d/t improvement in pain.    Time 6    Period Weeks    Status On-going                 Plan - 08/15/20 3546    Clinical Impression Statement Pt reports that her medicine has been helping her pain. She still shows pain with ABD and scaption planes. Focused on scap stability today with heavy cueing required to keep depressed  and retracted position of the shoulders. Also did STM to her R anterolateral shoulder to reduce muscle stiffness and pain. Good response during PROM and observed increased motion post STM. Ended session with ionto patch to R shoulder and w/o any complaints from patient.    Personal Factors and Comorbidities Age;Comorbidity 2;Past/Current Experience;Profession;Time since onset of injury/illness/exacerbation    Comorbidities HLD, GERD    PT Frequency 2x / week    PT Duration 6 weeks    PT Treatment/Interventions ADLs/Self Care Home Management;Cryotherapy;Electrical Stimulation;Iontophoresis 4mg /ml Dexamethasone;Moist Heat;Therapeutic exercise;Therapeutic activities;Functional mobility training;Ultrasound;Neuromuscular re-education;Patient/family education;Vasopneumatic Device;Taping;Energy conservation;Dry needling;Passive range of motion    PT Next Visit Plan reassess HEP; progress R shoulder PROM/AAROM/AROM to tolerance    Consulted and Agree with Plan of Care Patient           Patient will benefit from skilled therapeutic intervention in order to improve the following deficits and impairments:  Hypomobility,Increased edema,Decreased activity tolerance,Decreased strength,Pain,Impaired UE functional use,Increased fascial restricitons,Increased muscle spasms,Improper body mechanics,Decreased range of motion,Impaired flexibility,Postural dysfunction  Visit Diagnosis: Acute pain of right shoulder  Stiffness of right shoulder, not elsewhere classified  Muscle weakness (generalized)  Other symptoms and signs involving the musculoskeletal system     Problem List Patient Active Problem List   Diagnosis Date Noted  . Labral tear of shoulder 06/29/2020  . Primary osteoarthritis of right knee 06/29/2020  . BREAST MASS, LEFT 11/10/2009  . HYPERLIPIDEMIA 10/17/2009  . GERD 10/17/2009  . CHEST PAIN 10/17/2009    12/18/2009, PTA 08/15/2020, 10:03 AM  [facility] [address]  [address] [place], [place], [place] Phone: [phone]   Fax:  [phone]  Name: Graham Doukas MRN: Corie Chiquito Date of Birth: [birth year]

## 2020-08-16 ENCOUNTER — Encounter: Payer: 59 | Admitting: Physical Therapy

## 2020-08-17 ENCOUNTER — Other Ambulatory Visit: Payer: Self-pay

## 2020-08-17 ENCOUNTER — Encounter: Payer: Self-pay | Admitting: Family Medicine

## 2020-08-17 ENCOUNTER — Ambulatory Visit (INDEPENDENT_AMBULATORY_CARE_PROVIDER_SITE_OTHER): Payer: 59 | Admitting: Family Medicine

## 2020-08-17 VITALS — BP 118/70 | Ht 64.0 in | Wt 210.0 lb

## 2020-08-17 DIAGNOSIS — S43431D Superior glenoid labrum lesion of right shoulder, subsequent encounter: Secondary | ICD-10-CM

## 2020-08-17 MED ORDER — MELOXICAM 7.5 MG PO TABS: 7.5000 mg | ORAL_TABLET | Freq: Two times a day (BID) | ORAL | 1 refills | Status: DC | PRN

## 2020-08-17 MED ORDER — HYDROCODONE-ACETAMINOPHEN 5-325 MG PO TABS: 1.0000 | ORAL_TABLET | Freq: Three times a day (TID) | ORAL | 0 refills | Status: AC | PRN

## 2020-08-17 NOTE — Progress Notes (Signed)
  Erica Lindsey - 60 y.o. female MRN [id number]  Date of birth: 12/13/1960  SUBJECTIVE:  Including CC & ROS.  No chief complaint on file.   Erica Lindsey is a 60 y.o. female that is following up after MRI for her right shoulder.  This was demonstrating large effusion within the joint and a tear of the superior labrum.  [place] interpreter was used for this interview.  Review of Systems See HPI   HISTORY: Past Medical, Surgical, Social, and Family History Reviewed & Updated per EMR.   Pertinent Historical Findings include:  Past Medical History:  Diagnosis Date  . GERD (gastroesophageal reflux disease)   . HLD (hyperlipidemia)     Past Surgical History:  Procedure Laterality Date  . BLADDER REPAIR W/ CESAREAN SECTION      Family History  Problem Relation Age of Onset  . Hypertension Mother   . Diabetes Mother   . Stroke Father   . Heart attack Father     Social History   Socioeconomic History  . Marital status: Married    Spouse name: Not on file  . Number of children: Not on file  . Years of education: Not on file  . Highest education level: Not on file  Occupational History  . Not on file  Tobacco Use  . Smoking status: Never Smoker  . Smokeless tobacco: Never Used  . Tobacco comment: tobacco use- no   Substance and Sexual Activity  . Alcohol use: No  . Drug use: Never  . Sexual activity: Not on file  Other Topics Concern  . Not on file  Social History Narrative   ** Merged History Encounter **       Social Determinants of Health   Financial Resource Strain: Not on file  Food Insecurity: Not on file  Transportation Needs: Not on file  Physical Activity: Not on file  Stress: Not on file  Social Connections: Not on file  Intimate Partner Violence: Not on file     PHYSICAL EXAM:  VS: BP 118/70 (BP Location: Left Arm, Patient Position: Sitting, Cuff Size: Large)   Ht 5\' 4"  (1.626 m)   Wt 210 lb (95.3 kg)   BMI 36.05 kg/m   Physical Exam Gen: NAD, alert, cooperative with exam, well-appearing    ASSESSMENT & PLAN:   I spent 30 minutes with this patient, greater than 50% was face-to-face time counseling regarding the below diagnosis.   Labral tear of shoulder The MRI was demonstrating a large effusion and tearing of the labrum.  She is still having ongoing pain but does get improvement with therapy. -Counseled on home exercise therapy and supportive care. -Continue physical therapy. -Norco -Mobic. -Counseled on compression. -Discussed referral to surgery today and she would like to continue conservative measures

## 2020-08-17 NOTE — Assessment & Plan Note (Signed)
The MRI was demonstrating a large effusion and tearing of the labrum.  She is still having ongoing pain but does get improvement with therapy. -Counseled on home exercise therapy and supportive care. -Continue physical therapy. -Norco -Mobic. -Counseled on compression. -Discussed referral to surgery today and she would like to continue conservative measures

## 2020-08-17 NOTE — Patient Instructions (Signed)
Good to see you Please use heat  Please [place] the hydrocodone as needed for severe pain   Please send me a message in MyChart with any questions or updates.  Please see me back in 4 weeks.   --Dr. Jordan Likes

## 2020-08-23 ENCOUNTER — Encounter: Payer: Self-pay | Admitting: Physical Therapy

## 2020-08-23 ENCOUNTER — Other Ambulatory Visit: Payer: Self-pay

## 2020-08-23 ENCOUNTER — Ambulatory Visit: Payer: 59 | Admitting: Physical Therapy

## 2020-08-23 DIAGNOSIS — M6281 Muscle weakness (generalized): Secondary | ICD-10-CM

## 2020-08-23 DIAGNOSIS — M25611 Stiffness of right shoulder, not elsewhere classified: Secondary | ICD-10-CM

## 2020-08-23 DIAGNOSIS — M25511 Pain in right shoulder: Secondary | ICD-10-CM | POA: Diagnosis not present

## 2020-08-23 DIAGNOSIS — R29898 Other symptoms and signs involving the musculoskeletal system: Secondary | ICD-10-CM

## 2020-08-23 NOTE — Therapy (Signed)
[facility] [address]  [address] [place], [place], [address] Phone: [phone]   Fax:  [phone]  Physical Therapy Treatment  Patient Details  Name: Erica Lindsey MRN: [id number] Date of Birth: 07-31-1960 Referring Provider (PT): Clare Gandy, MD   Encounter Date: 08/23/2020   PT End of Session - 08/23/20 1058    Visit Number 6    Number of Visits 13    Date for PT Re-Evaluation 09/12/20    Authorization Type [employer]    PT Start Time 1018    PT Stop Time 1106   moist heat   PT Time Calculation (min) 48 min    Activity Tolerance Patient tolerated treatment well    Behavior During Therapy [facility] for tasks assessed/performed           Past Medical History:  Diagnosis Date  . GERD (gastroesophageal reflux disease)   . HLD (hyperlipidemia)     Past Surgical History:  Procedure Laterality Date  . BLADDER REPAIR W/ CESAREAN SECTION      There were no vitals filed for this visit.   Subjective Assessment - 08/23/20 1019    Subjective Saw her MD to discuss her MRI- which showed a problem with her shoulder. would like to avoid surgery. Notes benefit from ionto.    Pertinent History HLD, GERD    Diagnostic tests 08/13/20 R shoulder MRI: Significant rotator cuff tendinopathy/tendinosis with interstitial tears. No discrete full-thickness retracted tear. Significant thickening and edema of the superior labrum. moderate synovitis and bursitis.    Patient Stated Goals decrease pain    Currently in Pain? Yes    Pain Score 6     Pain Location Shoulder    Pain Orientation Right;Anterior    Pain Descriptors / Indicators Constant    Pain Type Acute pain                             [facility] Adult PT Treatment/Exercise - 08/23/20 0001      Shoulder Exercises: Supine   Horizontal ABduction Strengthening;Both;10 reps;Theraband    Theraband Level (Shoulder Horizontal ABduction) Level 1 (Yellow)     Horizontal ABduction Limitations cues for scap squeeze and slow movement      Shoulder Exercises: Sidelying   External Rotation AROM;Right;10 reps    External Rotation Limitations 2x10; limited ROM to tolerance   heavy cueing to maintain wrist neutral   ABduction AROM;Right;10 reps    ABduction Limitations thumb up; ROM to tolerance   1 episode with c/o shoulder "click" and pain which resolved     Shoulder Exercises: Standing   External Rotation Strengthening;Right;10 reps;Theraband    Theraband Level (Shoulder External Rotation) Level 1 (Yellow)    External Rotation Limitations isometric stepouts with shoudeer in neutral    Internal Rotation Strengthening;Right;10 reps;Theraband    Theraband Level (Shoulder Internal Rotation) Level 1 (Yellow)    Internal Rotation Limitations isometric stepouts with shoulder in neutral    Extension Strengthening;Both;10 reps;Theraband    Theraband Level (Shoulder Extension) Level 2 (Red)    Extension Limitations 2x10; cueing to avoid anterior trunk lean and shoulder hike    Row Strengthening;Both;10 reps;Theraband    Theraband Level (Shoulder Row) Level 3 (Green)    Row Limitations cues to depress shoulders      Shoulder Exercises: Pulleys   Flexion 3 minutes    Flexion Limitations to tolerance    Scaption 3 minutes  Scaption Limitations to tolerance      Moist Heat Therapy   Number Minutes Moist Heat 10 Minutes    Moist Heat Location Shoulder   R                 PT Education - 08/23/20 1058    Education Details update to HEP    Person(s) Educated Patient    Methods Explanation;Demonstration;Tactile cues;Verbal cues;Handout    Comprehension Verbalized understanding;Returned demonstration            PT Short Term Goals - 08/23/20 1100      PT SHORT TERM GOAL #1   Title Patient to be independent with initial HEP.    Time 3    Period Weeks    Status Achieved    Target Date 08/22/20             PT Long Term Goals -  08/04/20 1033      PT LONG TERM GOAL #1   Title Patient to be independent with advanced HEP.    Time 6    Period Weeks    Status On-going      PT LONG TERM GOAL #2   Title Patient to demonstrate R shoulder strength >/=4+/5.    Time 6    Period Weeks    Status On-going      PT LONG TERM GOAL #3   Title Patient to demonstrate R shoulder AROM and PROM WFL and with only mild pain remaining.    Time 6    Period Weeks    Status On-going      PT LONG TERM GOAL #4   Title Patient to report ability to do her hair without pain limiting.    Time 6    Period Weeks    Status On-going      PT LONG TERM GOAL #5   Title Patient to report 80% improvement in sleeping tolerance d/t improvement in pain.    Time 6    Period Weeks    Status On-going                 Plan - 08/23/20 1058    Clinical Impression Statement Patient arrived to session with report of seeing her MD recently for discussion of MRI, which showed RTC tendinosis with interstitial tears, thickening and edema of the superior labrum, and synovitis/bursitis. Patient reports that she would like to avoid surgery. Worked on isometric shoulder strengthening which patient demonstrated with good carryover and tolerance. Increased resistance with periscapular strengthening. Patient still requires intermittent cueing to depress scapulae. Also initiated sidelying shoulder AROM with which patient reported 1 episode of anterior R shoulder "click" and pain which resolved upon relaxing UE. Updated HEP with sidelying abduction with instruction to avoid pushing into pain. Patient reported understanding. Ended session with moist heat to R shoulder at end of session. Patient without complaints at end of session and is demonstrating improved tolerance with ther-ex.    Personal Factors and Comorbidities Age;Comorbidity 2;Past/Current Experience;Profession;Time since onset of injury/illness/exacerbation    Comorbidities HLD, GERD    PT Frequency  2x / week    PT Duration 6 weeks    PT Treatment/Interventions ADLs/Self Care Home Management;Cryotherapy;Electrical Stimulation;Iontophoresis 4mg /ml Dexamethasone;Moist Heat;Therapeutic exercise;Therapeutic activities;Functional mobility training;Ultrasound;Neuromuscular re-education;Patient/family education;Vasopneumatic Device;Taping;Energy conservation;Dry needling;Passive range of motion    PT Next Visit Plan progress R shoulder PROM/AAROM/AROM to tolerance    Consulted and Agree with Plan of Care Patient  Patient will benefit from skilled therapeutic intervention in order to improve the following deficits and impairments:  Hypomobility,Increased edema,Decreased activity tolerance,Decreased strength,Pain,Impaired UE functional use,Increased fascial restricitons,Increased muscle spasms,Improper body mechanics,Decreased range of motion,Impaired flexibility,Postural dysfunction  Visit Diagnosis: Acute pain of right shoulder  Stiffness of right shoulder, not elsewhere classified  Muscle weakness (generalized)  Other symptoms and signs involving the musculoskeletal system     Problem List Patient Active Problem List   Diagnosis Date Noted  . Labral tear of shoulder 06/29/2020  . Primary osteoarthritis of right knee 06/29/2020  . BREAST MASS, LEFT 11/10/2009  . HYPERLIPIDEMIA 10/17/2009  . GERD 10/17/2009  . CHEST PAIN 10/17/2009    Anette Guarneri, PT, DPT 08/23/20 12:10 PM   [facility] [address]  [address] [place], [place], [address] Phone: [phone]   Fax:  [phone]  Name: Para Cossey MRN: [id number] Date of Birth: Mar 29, 1961

## 2020-08-25 ENCOUNTER — Other Ambulatory Visit: Payer: Self-pay

## 2020-08-25 ENCOUNTER — Ambulatory Visit: Payer: 59

## 2020-08-25 DIAGNOSIS — M25511 Pain in right shoulder: Secondary | ICD-10-CM

## 2020-08-25 DIAGNOSIS — M6281 Muscle weakness (generalized): Secondary | ICD-10-CM

## 2020-08-25 DIAGNOSIS — M25611 Stiffness of right shoulder, not elsewhere classified: Secondary | ICD-10-CM

## 2020-08-25 DIAGNOSIS — R29898 Other symptoms and signs involving the musculoskeletal system: Secondary | ICD-10-CM

## 2020-08-25 NOTE — Therapy (Signed)
[facility] [address]  [address] [place], [place], [address] Phone: [phone]   Fax:  [phone]  Physical Therapy Treatment  Patient Details  Name: Erica Lindsey MRN: [id number] Date of Birth: January 08, 1961 Referring Provider (PT): Clare Gandy, MD   Encounter Date: 08/25/2020   PT End of Session - 08/25/20 0845    Visit Number 7    Number of Visits 13    Date for PT Re-Evaluation 09/12/20    Authorization Type [employer]    PT Start Time 0801    PT Stop Time 0855    PT Time Calculation (min) 54 min    Activity Tolerance Patient tolerated treatment well    Behavior During Therapy [facility] for tasks assessed/performed           Past Medical History:  Diagnosis Date  . GERD (gastroesophageal reflux disease)   . HLD (hyperlipidemia)     Past Surgical History:  Procedure Laterality Date  . BLADDER REPAIR W/ CESAREAN SECTION      There were no vitals filed for this visit.   Subjective Assessment - 08/25/20 0802    Subjective Pt reports pain all over her R shoulder. Did not sleep well last night.    Pertinent History HLD, GERD    Diagnostic tests 08/13/20 R shoulder MRI: Significant rotator cuff tendinopathy/tendinosis with interstitial tears. No discrete full-thickness retracted tear. Significant thickening and edema of the superior labrum. moderate synovitis and bursitis.    Patient Stated Goals decrease pain    Currently in Pain? Yes    Pain Score 7     Pain Location Shoulder    Pain Orientation Right;Anterior;Lateral    Pain Descriptors / Indicators Constant    Pain Type Acute pain                             [facility] Adult PT Treatment/Exercise - 08/25/20 0001      Shoulder Exercises: Supine   External Rotation AAROM;Right;10 reps    External Rotation Limitations supine with wand    Flexion AAROM;10 reps;Both    Flexion Limitations supine with wand    ABduction AAROM;Right;10  reps    ABduction Limitations supine with wand      Shoulder Exercises: Pulleys   Flexion 3 minutes    Flexion Limitations to tolerance    Scaption 3 minutes    Scaption Limitations to tolerance      Shoulder Exercises: Isometric Strengthening   Flexion 5X5"    Flexion Limitations with towel against wall    Internal Rotation 5X5"    Internal Rotation Limitations with towel against wall    ABduction 5X5"    ABduction Limitations with towel against wall      Moist Heat Therapy   Number Minutes Moist Heat 10 Minutes    Moist Heat Location Shoulder   R shld in S/L     Manual Therapy   Manual Therapy Passive ROM;Joint mobilization    Joint Mobilization R GH inferior glides grades II for pain    Passive ROM R shoulder gentle flexion and ABD                    PT Short Term Goals - 08/23/20 1100      PT SHORT TERM GOAL #1   Title Patient to be independent with initial HEP.    Time 3    Period Weeks  Status Achieved    Target Date 08/22/20             PT Long Term Goals - 08/04/20 1033      PT LONG TERM GOAL #1   Title Patient to be independent with advanced HEP.    Time 6    Period Weeks    Status On-going      PT LONG TERM GOAL #2   Title Patient to demonstrate R shoulder strength >/=4+/5.    Time 6    Period Weeks    Status On-going      PT LONG TERM GOAL #3   Title Patient to demonstrate R shoulder AROM and PROM WFL and with only mild pain remaining.    Time 6    Period Weeks    Status On-going      PT LONG TERM GOAL #4   Title Patient to report ability to do her hair without pain limiting.    Time 6    Period Weeks    Status On-going      PT LONG TERM GOAL #5   Title Patient to report 80% improvement in sleeping tolerance d/t improvement in pain.    Time 6    Period Weeks    Status On-going                 Plan - 08/25/20 1093    Clinical Impression Statement Pt reports that she was not able to sleep well last night d/t pain  in her R shoulder. Pt did ok with the exercises today, she did report a little pain during all the exercises but also noted that it was tolerable. Did some PROM with light joint mobs for pain and keep available ROM in shoulder. She was unable to do AAROM in supine with ABD d/t pain but other motions were tolerable. Pt continues to notes improvement with use of heat on her shoulder so ended session with heat for pain and stiffness inthe R shoulder.    Personal Factors and Comorbidities Age;Comorbidity 2;Past/Current Experience;Profession;Time since onset of injury/illness/exacerbation    Comorbidities HLD, GERD    PT Frequency 2x / week    PT Duration 6 weeks    PT Treatment/Interventions ADLs/Self Care Home Management;Cryotherapy;Electrical Stimulation;Iontophoresis 4mg /ml Dexamethasone;Moist Heat;Therapeutic exercise;Therapeutic activities;Functional mobility training;Ultrasound;Neuromuscular re-education;Patient/family education;Vasopneumatic Device;Taping;Energy conservation;Dry needling;Passive range of motion    PT Next Visit Plan progress R shoulder PROM/AAROM/AROM to tolerance; joint mobs for pain    Consulted and Agree with Plan of Care Patient           Patient will benefit from skilled therapeutic intervention in order to improve the following deficits and impairments:  Hypomobility,Increased edema,Decreased activity tolerance,Decreased strength,Pain,Impaired UE functional use,Increased fascial restricitons,Increased muscle spasms,Improper body mechanics,Decreased range of motion,Impaired flexibility,Postural dysfunction  Visit Diagnosis: Acute pain of right shoulder  Stiffness of right shoulder, not elsewhere classified  Muscle weakness (generalized)  Other symptoms and signs involving the musculoskeletal system     Problem List Patient Active Problem List   Diagnosis Date Noted  . Labral tear of shoulder 06/29/2020  . Primary osteoarthritis of right knee 06/29/2020  .  BREAST MASS, LEFT 11/10/2009  . HYPERLIPIDEMIA 10/17/2009  . GERD 10/17/2009  . CHEST PAIN 10/17/2009    12/18/2009, PTA 08/25/2020, 10:26 AM  [facility] [address]  [address] [place], [place], [place] Phone: [phone]   Fax:  [phone]  Name: Erica Lindsey MRN:  [id number] Date of Birth: Mar 03, 1961

## 2020-08-29 ENCOUNTER — Encounter: Payer: Self-pay | Admitting: Physical Therapy

## 2020-08-29 ENCOUNTER — Other Ambulatory Visit: Payer: Self-pay

## 2020-08-29 ENCOUNTER — Ambulatory Visit: Payer: 59 | Admitting: Physical Therapy

## 2020-08-29 DIAGNOSIS — R29898 Other symptoms and signs involving the musculoskeletal system: Secondary | ICD-10-CM

## 2020-08-29 DIAGNOSIS — M6281 Muscle weakness (generalized): Secondary | ICD-10-CM

## 2020-08-29 DIAGNOSIS — M25611 Stiffness of right shoulder, not elsewhere classified: Secondary | ICD-10-CM

## 2020-08-29 DIAGNOSIS — M25511 Pain in right shoulder: Secondary | ICD-10-CM | POA: Diagnosis not present

## 2020-08-29 NOTE — Patient Instructions (Signed)
Access Code: [email or web address] URL: [email or web address] Date: 08/29/2020 Prepared by: Georgina Peer  Exercises Supine Shoulder Horizontal Abduction with Resistance - 2 x daily - 7 x weekly - 2 sets - 10 reps Supine Shoulder Flexion Extension Full Range AROM - 2 x daily - 7 x weekly - 2 sets - 10 reps Sidelying Shoulder Abduction Palm Forward - 2 x daily - 7 x weekly - 2 sets - 10 reps Sidelying Shoulder ER with Towel and Dumbbell - 2 x daily - 7 x weekly - 2 sets - 10 reps Scapular Retraction with Resistance - 2 x daily - 7 x weekly - 2 sets - 10 reps Shoulder extension with resistance - Neutral - 2 x daily - 7 x weekly - 2 sets - 10 reps Shoulder External Rotation Reactive Isometrics - 2 x daily - 7 x weekly - 2 sets - 10 reps Shoulder Internal Rotation Reactive Isometrics - 2 x daily - 7 x weekly - 2 sets - 10 reps

## 2020-08-29 NOTE — Therapy (Signed)
[facility] [address]  [address] [place], [place], [address] Phone: [phone]   Fax:  [phone]  Physical Therapy Treatment  Patient Details  Name: Erica Lindsey MRN: [id number] Date of Birth: Apr 20, 1960 Referring Provider (PT): Clare Gandy, MD   Encounter Date: 08/29/2020   PT End of Session - 08/29/20 0844    Visit Number 8    Number of Visits 13    Date for PT Re-Evaluation 09/12/20    Authorization Type [employer]    PT Start Time 0804   pt late   PT Stop Time 0843    PT Time Calculation (min) 39 min    Activity Tolerance Patient tolerated treatment well    Behavior During Therapy [facility] for tasks assessed/performed           Past Medical History:  Diagnosis Date  . GERD (gastroesophageal reflux disease)   . HLD (hyperlipidemia)     Past Surgical History:  Procedure Laterality Date  . BLADDER REPAIR W/ CESAREAN SECTION      There were no vitals filed for this visit.   Subjective Assessment - 08/29/20 0804    Subjective Not much new since last session.    Pertinent History HLD, GERD    Diagnostic tests 08/13/20 R shoulder MRI: Significant rotator cuff tendinopathy/tendinosis with interstitial tears. No discrete full-thickness retracted tear. Significant thickening and edema of the superior labrum. moderate synovitis and bursitis.    Patient Stated Goals decrease pain    Currently in Pain? Yes    Pain Score 7     Pain Location Shoulder    Pain Orientation Right;Anterior;Lateral    Pain Descriptors / Indicators Constant    Pain Type Acute pain                             [facility] Adult PT Treatment/Exercise - 08/29/20 0001      Shoulder Exercises: Supine   Flexion AROM;Right;10 reps    Shoulder Flexion Weight (lbs) 0, 1    Flexion Limitations thumb up; ROM to tolerance; 2x10 2nd set with 1#      Shoulder Exercises: Sidelying   External Rotation AROM;Right;10 reps     External Rotation Weight (lbs) 1    External Rotation Limitations 2x10; improved ROM    ABduction AROM;Right;15 reps    ABduction Limitations thumb up; last 5 reps with 1#; ROM to tolerance   2 episode of shoulder "click"     Shoulder Exercises: Standing   External Rotation Strengthening;Right;10 reps;Theraband    Theraband Level (Shoulder External Rotation) Level 1 (Yellow)    External Rotation Limitations isometric stepouts with shoudeer in neutral    Internal Rotation Strengthening;Right;10 reps;Theraband    Theraband Level (Shoulder Internal Rotation) Level 1 (Yellow)    Internal Rotation Limitations isometric stepouts with shoulder in neutral      Shoulder Exercises: ROM/Strengthening   UBE (Upper Arm Bike) L1.0 forward 3 min, backward 1 min   discontinued d/t pain backwards                 PT Education - 08/29/20 0844    Education Details update/consolidation to HEP    Person(s) Educated Patient    Methods Explanation;Demonstration;Tactile cues;Verbal cues;Handout    Comprehension Verbalized understanding;Returned demonstration            PT Short Term Goals - 08/23/20 1100      PT SHORT  TERM GOAL #1   Title Patient to be independent with initial HEP.    Time 3    Period Weeks    Status Achieved    Target Date 08/22/20             PT Long Term Goals - 08/25/20 1027      PT LONG TERM GOAL #1   Title Patient to be independent with advanced HEP.    Time 6    Period Weeks    Status On-going      PT LONG TERM GOAL #2   Title Patient to demonstrate R shoulder strength >/=4+/5.    Time 6    Period Weeks    Status On-going      PT LONG TERM GOAL #3   Title Patient to demonstrate R shoulder AROM and PROM WFL and with only mild pain remaining.    Time 6    Period Weeks    Status On-going   mod pain with ER/ABD/flexion     PT LONG TERM GOAL #4   Title Patient to report ability to do her hair without pain limiting.    Time 6    Period Weeks     Status On-going      PT LONG TERM GOAL #5   Title Patient to report 80% improvement in sleeping tolerance d/t improvement in pain.    Time 6    Period Weeks    Status On-going                 Plan - 08/29/20 0844    Clinical Impression Statement Patient reporting pain unchanged since last session. Continued working on overhead elevation in flexion and abduction in gravity minimized positioning. Patient with 2 episodes of shoulder "click" with sidelying abduction, which resolved with subsequent reps. ROM in this plane is still limited, however patient was able to tolerate addition of light weight without additional pain. Supine shoulder flexion was performed with cueing to maintain extended elbow but with much improved tolerance. Updated HEP with consolidated exercises to increase challenge at home as patient is demonstrating improved ther-ex tolerance- patient reported understanding. Patient without complaints and declined modalities at end of session.    Personal Factors and Comorbidities Age;Comorbidity 2;Past/Current Experience;Profession;Time since onset of injury/illness/exacerbation    Comorbidities HLD, GERD    PT Frequency 2x / week    PT Duration 6 weeks    PT Treatment/Interventions ADLs/Self Care Home Management;Cryotherapy;Electrical Stimulation;Iontophoresis 4mg /ml Dexamethasone;Moist Heat;Therapeutic exercise;Therapeutic activities;Functional mobility training;Ultrasound;Neuromuscular re-education;Patient/family education;Vasopneumatic Device;Taping;Energy conservation;Dry needling;Passive range of motion    PT Next Visit Plan progress R shoulder PROM/AAROM/AROM to tolerance; joint mobs for pain    Consulted and Agree with Plan of Care Patient           Patient will benefit from skilled therapeutic intervention in order to improve the following deficits and impairments:  Hypomobility,Increased edema,Decreased activity tolerance,Decreased strength,Pain,Impaired UE  functional use,Increased fascial restricitons,Increased muscle spasms,Improper body mechanics,Decreased range of motion,Impaired flexibility,Postural dysfunction  Visit Diagnosis: Acute pain of right shoulder  Stiffness of right shoulder, not elsewhere classified  Muscle weakness (generalized)  Other symptoms and signs involving the musculoskeletal system     Problem List Patient Active Problem List   Diagnosis Date Noted  . Labral tear of shoulder 06/29/2020  . Primary osteoarthritis of right knee 06/29/2020  . BREAST MASS, LEFT 11/10/2009  . HYPERLIPIDEMIA 10/17/2009  . GERD 10/17/2009  . CHEST PAIN 10/17/2009     12/18/2009, PT, DPT 08/29/20  8:46 AM   [facility] [address]  [address] [place], [place], [address] Phone: [phone]   Fax:  [phone]  Name: Erica Lindsey MRN: [id number] Date of Birth: 11/20/1960

## 2020-08-31 ENCOUNTER — Encounter: Payer: Self-pay | Admitting: Physical Therapy

## 2020-08-31 ENCOUNTER — Other Ambulatory Visit: Payer: Self-pay

## 2020-08-31 ENCOUNTER — Ambulatory Visit: Payer: 59 | Admitting: Physical Therapy

## 2020-08-31 DIAGNOSIS — M25511 Pain in right shoulder: Secondary | ICD-10-CM

## 2020-08-31 DIAGNOSIS — M6281 Muscle weakness (generalized): Secondary | ICD-10-CM

## 2020-08-31 DIAGNOSIS — R29898 Other symptoms and signs involving the musculoskeletal system: Secondary | ICD-10-CM

## 2020-08-31 DIAGNOSIS — M25611 Stiffness of right shoulder, not elsewhere classified: Secondary | ICD-10-CM

## 2020-08-31 NOTE — Patient Instructions (Signed)

## 2020-08-31 NOTE — Therapy (Signed)
[facility] [address]  [address] [place], [place], [address] Phone: [phone]   Fax:  [phone]  Physical Therapy Treatment  Patient Details  Name: Erica Lindsey MRN: [id number] Date of Birth: [birth year] Referring Provider (PT): Clare Gandy, MD   Encounter Date: 08/31/2020   PT End of Session - 08/31/20 0935    Visit Number 9    Number of Visits 13    Date for PT Re-Evaluation 09/12/20    Authorization Type [employer]    PT Start Time [phone]    PT Stop Time 0941    PT Time Calculation (min) 48 min    Activity Tolerance Patient limited by pain    Behavior During Therapy [facility] for tasks assessed/performed           Past Medical History:  Diagnosis Date  . GERD (gastroesophageal reflux disease)   . HLD (hyperlipidemia)     Past Surgical History:  Procedure Laterality Date  . BLADDER REPAIR W/ CESAREAN SECTION      There were no vitals filed for this visit.   Subjective Assessment - 08/31/20 0857    Subjective Had trouble sleeping last night d/t pain even after taking pain meds. Wearing a shoulder sleeve but it is not helping. Feels like her shoulder pain is getting worse but noites that she feels embarrassed to call the MD about it too often.    Pertinent History HLD, GERD    Diagnostic tests 08/13/20 R shoulder MRI: Significant rotator cuff tendinopathy/tendinosis with interstitial tears. No discrete full-thickness retracted tear. Significant thickening and edema of the superior labrum. moderate synovitis and bursitis.    Patient Stated Goals decrease pain    Currently in Pain? Yes    Pain Score 10-Worst pain ever    Pain Location Shoulder    Pain Orientation Right    Pain Descriptors / Indicators --   unable to describe   Pain Type Acute pain                             [facility] Adult PT Treatment/Exercise - 08/31/20 0001      Electrical Stimulation   Electrical Stimulation  Location R shoulder complex    Electrical Stimulation Action IFC    Electrical Stimulation Parameters output to tolerance; 10 min    Electrical Stimulation Goals Pain      Manual Therapy   Manual Therapy Soft tissue mobilization;Passive ROM    Manual therapy comments supine    Soft tissue mobilization STM to R pec, biceps muscel belly, UT    Passive ROM R shoulder gentle PROM in all directions pain-free                  PT Education - 08/31/20 0934    Education Details edu on TENS use, benefits, precautions, electrode placement; edu on importance of self-advocacy in healthcare    Person(s) Educated Patient    Methods Explanation;Demonstration;Tactile cues;Verbal cues;Handout    Comprehension Returned demonstration;Verbalized understanding            PT Short Term Goals - 08/23/20 1100      PT SHORT TERM GOAL #1   Title Patient to be independent with initial HEP.    Time 3    Period Weeks    Status Achieved    Target Date 08/22/20             PT Long  Term Goals - 08/25/20 1027      PT LONG TERM GOAL #1   Title Patient to be independent with advanced HEP.    Time 6    Period Weeks    Status On-going      PT LONG TERM GOAL #2   Title Patient to demonstrate R shoulder strength >/=4+/5.    Time 6    Period Weeks    Status On-going      PT LONG TERM GOAL #3   Title Patient to demonstrate R shoulder AROM and PROM WFL and with only mild pain remaining.    Time 6    Period Weeks    Status On-going   mod pain with ER/ABD/flexion     PT LONG TERM GOAL #4   Title Patient to report ability to do her hair without pain limiting.    Time 6    Period Weeks    Status On-going      PT LONG TERM GOAL #5   Title Patient to report 80% improvement in sleeping tolerance d/t improvement in pain.    Time 6    Period Weeks    Status On-going                 Plan - 08/31/20 0936    Clinical Impression Statement Patient arrived to session with report of 10/10  R shoulder pain since not being able to sleep last night. Notes worsening shoulder pain without known cause. Notes that she feels embarrassed to contact her MD about her shoulder too often, however discussed with patient the importance of advocating for one's self. Worked on gentle R shoulder STM and PROM to tolerance for pain relief. Educated patient on TENS unit use for management of pain at home- patient reported understanding. Ended session with moist heat and e-stim to R shoulder for pain relief. Patient without further complaints at end of session. Plan to contact referring MD concerning patient's pain levels.    Personal Factors and Comorbidities Age;Comorbidity 2;Past/Current Experience;Profession;Time since onset of injury/illness/exacerbation    Comorbidities HLD, GERD    PT Frequency 2x / week    PT Duration 6 weeks    PT Treatment/Interventions ADLs/Self Care Home Management;Cryotherapy;Electrical Stimulation;Iontophoresis 4mg /ml Dexamethasone;Moist Heat;Therapeutic exercise;Therapeutic activities;Functional mobility training;Ultrasound;Neuromuscular re-education;Patient/family education;Vasopneumatic Device;Taping;Energy conservation;Dry needling;Passive range of motion    PT Next Visit Plan progress R shoulder PROM/AAROM/AROM to tolerance; joint mobs for pain    Consulted and Agree with Plan of Care Patient           Patient will benefit from skilled therapeutic intervention in order to improve the following deficits and impairments:  Hypomobility,Increased edema,Decreased activity tolerance,Decreased strength,Pain,Impaired UE functional use,Increased fascial restricitons,Increased muscle spasms,Improper body mechanics,Decreased range of motion,Impaired flexibility,Postural dysfunction  Visit Diagnosis: Acute pain of right shoulder  Stiffness of right shoulder, not elsewhere classified  Muscle weakness (generalized)  Other symptoms and signs involving the musculoskeletal  system     Problem List Patient Active Problem List   Diagnosis Date Noted  . Labral tear of shoulder 06/29/2020  . Primary osteoarthritis of right knee 06/29/2020  . BREAST MASS, LEFT 11/10/2009  . HYPERLIPIDEMIA 10/17/2009  . GERD 10/17/2009  . CHEST PAIN 10/17/2009     12/18/2009, PT, DPT 08/31/20 11:00 AM   [facility] [address]  [address] [place], [place], [place] Phone: [phone]   Fax:  [phone]  Name: Erica Lindsey MRN: Corie Chiquito Date of Birth:  08/26/1960   

## 2020-09-12 ENCOUNTER — Ambulatory Visit: Payer: 59 | Attending: Family Medicine

## 2020-09-12 ENCOUNTER — Other Ambulatory Visit: Payer: Self-pay

## 2020-09-12 DIAGNOSIS — R29898 Other symptoms and signs involving the musculoskeletal system: Secondary | ICD-10-CM | POA: Insufficient documentation

## 2020-09-12 DIAGNOSIS — M25511 Pain in right shoulder: Secondary | ICD-10-CM | POA: Insufficient documentation

## 2020-09-12 DIAGNOSIS — M25611 Stiffness of right shoulder, not elsewhere classified: Secondary | ICD-10-CM | POA: Diagnosis present

## 2020-09-12 DIAGNOSIS — M6281 Muscle weakness (generalized): Secondary | ICD-10-CM

## 2020-09-12 NOTE — Therapy (Addendum)
[facility] Outpatient Rehabilitation [facility] [address]  [address] [place], [place], [address] Phone: [phone]   Fax:  [phone]  Physical Therapy Progress Note/Recert  Patient Details  Name: Erica Lindsey MRN: [id number] Date of Birth: 06-17-1960 Referring Provider (PT): Clare Gandy, MD   Encounter Date: 09/12/2020   PT End of Session - 09/12/20 1023    Visit Number 10    Number of Visits 18    Date for PT Re-Evaluation 10/10/20    Authorization Type [employer]    PT Start Time 0940   pt late   PT Stop Time 1015    PT Time Calculation (min) 35 min    Activity Tolerance Patient limited by pain    Behavior During Therapy [facility] for tasks assessed/performed           Past Medical History:  Diagnosis Date  . GERD (gastroesophageal reflux disease)   . HLD (hyperlipidemia)     Past Surgical History:  Procedure Laterality Date  . BLADDER REPAIR W/ CESAREAN SECTION      There were no vitals filed for this visit.   Subjective Assessment - 09/12/20 0945    Subjective Pt reports that she feel on Saturday, braced onto her arms to catch herself and has been in more pain ever since. Meloxicam has made her pain better this morning.   Pertinent History HLD, GERD    Diagnostic tests 08/13/20 R shoulder MRI: Significant rotator cuff tendinopathy/tendinosis with interstitial tears. No discrete full-thickness retracted tear. Significant thickening and edema of the superior labrum. moderate synovitis and bursitis.    Patient Stated Goals decrease pain    Currently in Pain? Yes    Pain Score 5     Pain Location Shoulder    Pain Orientation Right    Pain Descriptors / Indicators Aching;Constant    Pain Type Acute pain              [facility] PT Assessment - 09/12/20 0001      Assessment   Medical Diagnosis Tear of R glenoid labrum    Referring Provider (PT) Clare Gandy, MD    Onset Date/Surgical Date 06/03/20      AROM   Right Shoulder  Flexion 120 Degrees   10/10 pain   Right Shoulder ABduction 103 Degrees      PROM   Right Shoulder Flexion 132 Degrees    Right Shoulder ABduction 118 Degrees      Strength   Right Shoulder Flexion 4/5    Right Shoulder ABduction 4-/5    Right Shoulder Internal Rotation 3+/5    Right Shoulder External Rotation 4/5    Left Shoulder Flexion 4+/5    Left Shoulder ABduction 4+/5    Left Shoulder Internal Rotation 4+/5    Left Shoulder External Rotation 4+/5                         [facility] Adult PT Treatment/Exercise - 09/12/20 0001      Shoulder Exercises: Pulleys   Flexion 2 minutes    Flexion Limitations to tolerance    Scaption 2 minutes    Scaption Limitations to tolerance                    PT Short Term Goals - 09/12/20 1158      PT SHORT TERM GOAL #1   Title Patient to be independent with initial HEP.    Time  3    Period Weeks    Status Achieved    Target Date 08/22/20             PT Long Term Goals - 09/12/20 0950      PT LONG TERM GOAL #1   Title Patient to be independent with advanced HEP.    Time 4    Period Weeks    Status On-going    Target Date 10/10/20      PT LONG TERM GOAL #2   Title Patient to demonstrate R shoulder strength >/=4+/5.    Time 4    Period Weeks    Status On-going   global weakness in the R UE   Target Date 10/10/20      PT LONG TERM GOAL #3   Title Patient to demonstrate R shoulder AROM and PROM WFL and with only mild pain remaining.    Time 4    Period Weeks    Status On-going   severe pain with ABD/flexion   Target Date 10/10/20      PT LONG TERM GOAL #4   Title Patient to report ability to do her hair without pain limiting.    Time 4    Period Weeks    Status On-going   very difficult   Target Date 10/10/20      PT LONG TERM GOAL #5   Title Patient to report 80% improvement in sleeping tolerance d/t improvement in pain.    Time 4    Period Weeks    Status On-going   has to take pain meds  to sleep   Target Date 10/10/20                 Plan - 09/12/20 1120    Clinical Impression Statement Erica Lindsey has been in constant pain since that start of PT. She still reports being embarrased about talking to her doctor about her pain but encouraged her to reach out to discuss further options. She shows improvement in R shoulder motions but has severe pain when doing them. She demonstrates global weakness in her R UE. She reported that her sleeping is only better with pain medication and she currently is unable to do her hair d/t R shoulder pain. She wants to continue with PT at the same frequency (2x per week). She feels like exercises help but continues to have high pain levels each visit. Overall she is making very slow progress toward goals and would benefit from continued PT to improve R shoulder ROM, strength, and function.    Personal Factors and Comorbidities Age;Comorbidity 2;Past/Current Experience;Profession;Time since onset of injury/illness/exacerbation    Comorbidities HLD, GERD    PT Frequency 2x / week    PT Duration 4 weeks    PT Treatment/Interventions ADLs/Self Care Home Management;Cryotherapy;Electrical Stimulation;Iontophoresis 4mg /ml Dexamethasone;Moist Heat;Therapeutic exercise;Therapeutic activities;Functional mobility training;Ultrasound;Neuromuscular re-education;Patient/family education;Vasopneumatic Device;Taping;Energy conservation;Dry needling;Passive range of motion    PT Next Visit Plan progress R shoulder PROM/AAROM/AROM to tolerance; joint mobs for pain    Consulted and Agree with Plan of Care Patient           Patient will benefit from skilled therapeutic intervention in order to improve the following deficits and impairments:  Hypomobility,Increased edema,Decreased activity tolerance,Decreased strength,Pain,Impaired UE functional use,Increased fascial restricitons,Increased muscle spasms,Improper body mechanics,Decreased range of motion,Impaired  flexibility,Postural dysfunction  Visit Diagnosis: Acute pain of right shoulder  Stiffness of right shoulder, not elsewhere classified  Muscle weakness (generalized)  Other symptoms and signs involving the  musculoskeletal system     Problem List Patient Active Problem List   Diagnosis Date Noted  . Labral tear of shoulder 06/29/2020  . Primary osteoarthritis of right knee 06/29/2020  . BREAST MASS, LEFT 11/10/2009  . HYPERLIPIDEMIA 10/17/2009  . GERD 10/17/2009  . CHEST PAIN 10/17/2009    Darleene Cleaver, PTA 09/12/2020, 11:29 AM  [facility] [address]  [address] [place], [place], [address] Phone: [phone]   Fax:  [phone]  Name: Erica Lindsey MRN: [id number] Date of Birth: 03/03/1961  Patient has objectively demonstrated good progress with therapy, particularly with shoulder PROM/AROM and strength testing. However, still notes consistently high pain levels, most recently exaccerbated by a FOOSH. Did reach out to referring MD about patient's pain levels as controlling pain will allow her to have improved tolerance for therapy and ADLs. Patient will benefit from continued skilled PT services 2x/week for 4 weeks to address remaining goals.   Anette Guarneri, PT, DPT 09/12/20 12:03 PM

## 2020-09-12 NOTE — Addendum Note (Signed)
Addended by: Anette Guarneri D on: 09/12/2020 12:05 PM   Modules accepted: Orders

## 2020-09-13 ENCOUNTER — Ambulatory Visit: Payer: Self-pay

## 2020-09-13 ENCOUNTER — Ambulatory Visit (INDEPENDENT_AMBULATORY_CARE_PROVIDER_SITE_OTHER): Payer: 59 | Admitting: Family Medicine

## 2020-09-13 VITALS — BP 120/80 | Ht 64.0 in | Wt 205.0 lb

## 2020-09-13 DIAGNOSIS — M1711 Unilateral primary osteoarthritis, right knee: Secondary | ICD-10-CM | POA: Diagnosis not present

## 2020-09-13 DIAGNOSIS — S43431D Superior glenoid labrum lesion of right shoulder, subsequent encounter: Secondary | ICD-10-CM | POA: Diagnosis not present

## 2020-09-13 MED ORDER — PREDNISONE 5 MG PO TABS: ORAL_TABLET | ORAL | 0 refills | Status: AC

## 2020-09-13 NOTE — Assessment & Plan Note (Signed)
Seems to exacerbate her underlying changes. -Counseled on home exercise therapy and supportive care. -Prednisone. -Could consider injection

## 2020-09-13 NOTE — Patient Instructions (Signed)
Good to see you Please try ice on the shoulder and knee  Please use the norco as needed   Please send me a message in MyChart with any questions or updates.  Please see me back in 4 weeks.   --Dr. Jordan Likes

## 2020-09-13 NOTE — Progress Notes (Signed)
  Erica Lindsey Lindsey - 60 y.o. female MRN [id number]  Date of birth: 10-02-1960  SUBJECTIVE:  Including CC & ROS.  No chief complaint on file.   Erica Lindsey is a 60 y.o. female that is presenting with acute change in increased pain of her right shoulder and right knee.  She had a fall back to where she had to brace herself with her right knee and right shoulder.  Since that time she has had ongoing pain.  Has gotten worse over the past few days.   Review of Systems See HPI   HISTORY: Past Medical, Surgical, Social, and Family History Reviewed & Updated per EMR.   Pertinent Historical Findings include:  Past Medical History:  Diagnosis Date  . GERD (gastroesophageal reflux disease)   . HLD (hyperlipidemia)     Past Surgical History:  Procedure Laterality Date  . BLADDER REPAIR W/ CESAREAN SECTION      Family History  Problem Relation Age of Onset  . Hypertension Mother   . Diabetes Mother   . Stroke Father   . Heart attack Father     Social History   Socioeconomic History  . Marital status: Married    Spouse name: Not on file  . Number of children: Not on file  . Years of education: Not on file  . Highest education level: Not on file  Occupational History  . Not on file  Tobacco Use  . Smoking status: Never Smoker  . Smokeless tobacco: Never Used  . Tobacco comment: tobacco use- no   Substance and Sexual Activity  . Alcohol use: No  . Drug use: Never  . Sexual activity: Not on file  Other Topics Concern  . Not on file  Social History Narrative   ** Merged History Encounter **       Social Determinants of Health   Financial Resource Strain: Not on file  Food Insecurity: Not on file  Transportation Needs: Not on file  Physical Activity: Not on file  Stress: Not on file  Social Connections: Not on file  Intimate Partner Violence: Not on file     PHYSICAL EXAM:  VS: BP 120/80   Ht 5\' 4"  (1.626 m)   Wt 205 lb (93 kg)   BMI 35.19 kg/m   Physical Exam Gen: NAD, alert, cooperative with exam, well-appearing MSK:  Right shoulder: Limited range of motion. Normal strength resistance. Right knee: Normal range of motion. No instability. Normal strength resistance. Neurovascular intact  Limited ultrasound: Right knee:  Mild effusion in suprapatellar pouch. Normal-appearing quadricep patellar tendon. Moderate medial joint space narrowing. No change of the MCL.   Summary: Degenerative changes appreciated    Ultrasound and interpretation by , MD    ASSESSMENT & PLAN:   Labral tear of shoulder Seems to exacerbate her underlying changes. -Counseled on home exercise therapy and supportive care. -Prednisone. -Could consider injection  Primary osteoarthritis of right knee Likely has exacerbated her underlying degenerative changes. -Counseled on home exercise therapy and supportive care. -Prednisone. -Could consider injection.

## 2020-09-13 NOTE — Assessment & Plan Note (Signed)
Likely has exacerbated her underlying degenerative changes. -Counseled on home exercise therapy and supportive care. -Prednisone. -Could consider injection.

## 2020-09-19 ENCOUNTER — Ambulatory Visit: Payer: 59 | Admitting: Family Medicine

## 2020-10-11 ENCOUNTER — Encounter: Payer: Self-pay | Admitting: Family Medicine

## 2020-10-11 ENCOUNTER — Ambulatory Visit: Payer: Self-pay

## 2020-10-11 ENCOUNTER — Ambulatory Visit (INDEPENDENT_AMBULATORY_CARE_PROVIDER_SITE_OTHER): Payer: 59 | Admitting: Family Medicine

## 2020-10-11 ENCOUNTER — Other Ambulatory Visit: Payer: Self-pay

## 2020-10-11 VITALS — BP 140/90 | Ht 64.0 in | Wt 205.0 lb

## 2020-10-11 DIAGNOSIS — S43431D Superior glenoid labrum lesion of right shoulder, subsequent encounter: Secondary | ICD-10-CM | POA: Diagnosis not present

## 2020-10-11 DIAGNOSIS — M1711 Unilateral primary osteoarthritis, right knee: Secondary | ICD-10-CM | POA: Diagnosis not present

## 2020-10-11 MED ORDER — TRIAMCINOLONE ACETONIDE 40 MG/ML IJ SUSP
40.0000 mg | Freq: Once | INTRAMUSCULAR | Status: AC
  Administered 2020-10-11: 40 mg via INTRA_ARTICULAR

## 2020-10-11 NOTE — Assessment & Plan Note (Signed)
Exacerbation of her underlying pain. -Counseled on home exercise therapy and supportive care. - injection today  - referral to PT  - could consider referral to surgeon

## 2020-10-11 NOTE — Progress Notes (Signed)
Erica Lindsey - 60 y.o. female MRN [id number]  Date of birth: [birth year]  SUBJECTIVE:  Including CC & ROS.  No chief complaint on file.   Erica Lindsey is a 60 y.o. female that is presenting with worsening of her right shoulder and right knee pain.  Has been getting worse over the past 3 weeks.  She was seen in the emergency department.  Was provided a muscle relaxer and meloxicam.  Symptoms are still worsening..  Review of the right shoulder x-ray from 7/3 shows no acute changes. Review of the right knee x-ray from 7/3 shows degenerative changes of the medial compartment.   Review of Systems See HPI   HISTORY: Past Medical, Surgical, Social, and Family History Reviewed & Updated per EMR.   Pertinent Historical Findings include:  Past Medical History:  Diagnosis Date   GERD (gastroesophageal reflux disease)    HLD (hyperlipidemia)     Past Surgical History:  Procedure Laterality Date   BLADDER REPAIR W/ CESAREAN SECTION      Family History  Problem Relation Age of Onset   Hypertension Mother    Diabetes Mother    Stroke Father    Heart attack Father     Social History   Socioeconomic History   Marital status: Married    Spouse name: Not on file   Number of children: Not on file   Years of education: Not on file   Highest education level: Not on file  Occupational History   Not on file  Tobacco Use   Smoking status: Never   Smokeless tobacco: Never   Tobacco comments:    tobacco use- no   Substance and Sexual Activity   Alcohol use: No   Drug use: Never   Sexual activity: Not on file  Other Topics Concern   Not on file  Social History Narrative   ** Merged History Encounter **       Social Determinants of Health   Financial Resource Strain: Not on file  Food Insecurity: Not on file  Transportation Needs: Not on file  Physical Activity: Not on file  Stress: Not on file  Social Connections: Not on file  Intimate Partner Violence: Not on  file     PHYSICAL EXAM:  VS: BP 140/90 (BP Location: Left Arm, Patient Position: Sitting, Cuff Size: Large)   Ht 5\' 4"  (1.626 m)   Wt 205 lb (93 kg)   BMI 35.19 kg/m  Physical Exam Gen: NAD, alert, cooperative with exam, well-appearing    Aspiration/Injection Procedure Note Erica Lindsey [birth year]  Procedure: Injection Indications: Right knee pain  Procedure Details Consent: Risks of procedure as well as the alternatives and risks of each were explained to the (patient/caregiver).  Consent for procedure obtained. Time Out: Verified patient identification, verified procedure, site/side was marked, verified correct patient position, special equipment/implants available, medications/allergies/relevent history reviewed, required imaging and test results available.  Performed.  The area was cleaned with iodine and alcohol swabs.    The right knee superior lateral suprapatellar pouch was injected using 1 cc's of 40 mg Kenalog and 4 cc's of 0.25% bupivacaine with a 22 1 1/2" needle.  Ultrasound was used. Images were obtained in long views showing the injection.     A sterile dressing was applied.  Patient did tolerate procedure well.   Aspiration/Injection Procedure Note Erica Lindsey 07-01-1960  Procedure: Injection Indications: Right shoulder pain  Procedure Details Consent: Risks of procedure as well  as the alternatives and risks of each were explained to the (patient/caregiver).  Consent for procedure obtained. Time Out: Verified patient identification, verified procedure, site/side was marked, verified correct patient position, special equipment/implants available, medications/allergies/relevent history reviewed, required imaging and test results available.  Performed.  The area was cleaned with iodine and alcohol swabs.    The right posterior glenohumeral joint was injected using 3 cc of 1% lidocaine on a 22-gauge 3-1/2 inch needle.  The syringe was switched to  mixture containing 1 cc's of 40 mg Kenalog and 4 cc's of 0.25% bupivacaine was injected.  Ultrasound was used. Images were obtained in short views showing the injection.     A sterile dressing was applied.  Patient did tolerate procedure well.      ASSESSMENT & PLAN:   Labral tear of shoulder Exacerbation of her underlying pain. -Counseled on home exercise therapy and supportive care. - injection today  - referral to PT  - could consider referral to surgeon    Primary osteoarthritis of right knee Acute on chronic in nature.  Likely has component of degenerative changes contributing to her pain. -Counseled on home exercise therapy and supportive care. -Injection today. -Referral to physical therapy. -Could consider PRP or gel injection.

## 2020-10-11 NOTE — Assessment & Plan Note (Signed)
Acute on chronic in nature.  Likely has component of degenerative changes contributing to her pain. -Counseled on home exercise therapy and supportive care. -Injection today. -Referral to physical therapy. -Could consider PRP or gel injection.

## 2020-10-11 NOTE — Patient Instructions (Addendum)
Good to see you °Please try ice   °Please try physical therapy °Please send me a message in MyChart with any questions or updates.  °Please see me back in 4 weeks.  ° °--Dr. Karstyn Birkey  ° °

## 2020-10-16 ENCOUNTER — Ambulatory Visit: Payer: 59 | Admitting: Rehabilitative and Restorative Service Providers"

## 2020-10-18 ENCOUNTER — Other Ambulatory Visit: Payer: Self-pay | Admitting: Family Medicine

## 2020-10-18 ENCOUNTER — Ambulatory Visit: Payer: 59 | Admitting: Rehabilitative and Restorative Service Providers"

## 2020-10-18 DIAGNOSIS — S43431D Superior glenoid labrum lesion of right shoulder, subsequent encounter: Secondary | ICD-10-CM

## 2020-10-20 ENCOUNTER — Other Ambulatory Visit: Payer: Self-pay

## 2020-10-20 ENCOUNTER — Ambulatory Visit: Payer: 59 | Attending: Family Medicine | Admitting: Rehabilitative and Restorative Service Providers"

## 2020-10-20 ENCOUNTER — Encounter: Payer: Self-pay | Admitting: Rehabilitative and Restorative Service Providers"

## 2020-10-20 DIAGNOSIS — R262 Difficulty in walking, not elsewhere classified: Secondary | ICD-10-CM | POA: Diagnosis present

## 2020-10-20 DIAGNOSIS — R29898 Other symptoms and signs involving the musculoskeletal system: Secondary | ICD-10-CM | POA: Diagnosis present

## 2020-10-20 DIAGNOSIS — M25611 Stiffness of right shoulder, not elsewhere classified: Secondary | ICD-10-CM | POA: Diagnosis present

## 2020-10-20 DIAGNOSIS — R252 Cramp and spasm: Secondary | ICD-10-CM | POA: Insufficient documentation

## 2020-10-20 DIAGNOSIS — M6281 Muscle weakness (generalized): Secondary | ICD-10-CM | POA: Insufficient documentation

## 2020-10-20 DIAGNOSIS — M25561 Pain in right knee: Secondary | ICD-10-CM | POA: Diagnosis present

## 2020-10-20 DIAGNOSIS — M25511 Pain in right shoulder: Secondary | ICD-10-CM | POA: Insufficient documentation

## 2020-10-20 DIAGNOSIS — M25661 Stiffness of right knee, not elsewhere classified: Secondary | ICD-10-CM | POA: Diagnosis present

## 2020-10-20 DIAGNOSIS — R600 Localized edema: Secondary | ICD-10-CM | POA: Insufficient documentation

## 2020-10-20 NOTE — Patient Instructions (Signed)
Access Code: [email or web address] URL: [email or web address] Date: 10/20/2020 Prepared by: Clydie Braun Clotile Whittington  Exercises Seated Scapular Retraction - 2 x daily - 7 x weekly - 2 sets - 10 reps Shoulder External Rotation and Scapular Retraction with Resistance - 2 x daily - 7 x weekly - 2 sets - 10 reps Standing Shoulder Horizontal Abduction with Resistance - 2 x daily - 7 x weekly - 2 sets - 10 reps Shoulder Flexion Wall Slide with Towel - 2 x daily - 7 x weekly - 2 sets - 10 reps Standing Shoulder Abduction Slides at Wall - 2 x daily - 7 x weekly - 2 sets - 10 reps Sit to Stand Without Arm Support - 2 x daily - 7 x weekly - 1 sets - 10 reps Seated Long Arc Quad - 2 x daily - 7 x weekly - 2 sets - 10 reps

## 2020-10-20 NOTE — Therapy (Signed)
[facility] Outpatient Rehabilitation [facility] [address]  [address] [place], [place], [address] Phone: [phone]   Fax:  [phone]  Physical Therapy Evaluation/Re-Eval  Patient Details  Name: Erica Lindsey MRN: [id number] Date of Birth: 1961/03/08 Referring Provider (PT): Clare Gandy, MD   Encounter Date: 10/20/2020   PT End of Session - 10/20/20 1048     Visit Number 11    Number of Visits 30    Date for PT Re-Evaluation 12/08/20    Authorization Type [employer]    PT Start Time 0800    PT Stop Time 0845    PT Time Calculation (min) 45 min    Activity Tolerance Patient tolerated treatment well    Behavior During Therapy [facility] for tasks assessed/performed             Past Medical History:  Diagnosis Date   GERD (gastroesophageal reflux disease)    HLD (hyperlipidemia)     Past Surgical History:  Procedure Laterality Date   BLADDER REPAIR W/ CESAREAN SECTION      There were no vitals filed for this visit.    Subjective Assessment - 10/20/20 0802     Subjective Pt states that after she left her last session of PT, she had a chair slip out from under her and she fell and tried to catch herself with her R shoulder and knee and reinjured herself more.  She has had a steroid injection by her MD in her knee and shoulder and was feeling better, but approx 3 days ago, she started feeling some pain in her R shoulder again.    Pertinent History HLD, GERD    Limitations Lifting;House hold activities    Diagnostic tests 08/13/20 R shoulder MRI: Significant rotator cuff tendinopathy/tendinosis with interstitial tears. No discrete full-thickness retracted tear. Significant thickening and edema of the superior labrum. moderate synovitis and bursitis.    Currently in Pain? Yes    Pain Score 4     Pain Location Shoulder    Pain Orientation Right    Pain Descriptors / Indicators Sore;Aching    Pain Type Chronic pain    Multiple Pain Sites Yes     Pain Score 5    Pain Location Knee    Pain Orientation Right    Pain Descriptors / Indicators Aching    Pain Type Acute pain    Pain Onset 1 to 4 weeks ago    Pain Frequency Intermittent                [facility] PT Assessment - 10/20/20 0001       Assessment   Medical Diagnosis R glenoid labrum tear, R knee OA    Referring Provider (PT) Clare Gandy, MD    Hand Dominance Right    Next MD Visit 11/13/20    Prior Therapy yes      Precautions   Precautions None      Restrictions   Weight Bearing Restrictions No      Balance Screen   Has the patient fallen in the past 6 months Yes    How many times? 1    Has the patient had a decrease in activity level because of a fear of falling?  Yes    Is the patient reluctant to leave their home because of a fear of falling?  Yes      Home Environment   Living Environment Private residence    Living Arrangements Spouse/significant other  Available Help at Discharge Family    Type of Home House    Home Access Stairs to enter    Home Layout One level      Prior Function   Level of Independence Independent    Vocation Self employed    Vocation Requirements sells furniture- lifting,, bending, walking    Leisure enjoys when her children come to visit, plays games with family      Cognition   Overall Cognitive Status Within Functional Limits for tasks assessed      Observation/Other Assessments   Focus on Therapeutic Outcomes (FOTO)  Knee 58% (visit 1).  Shoulder 53% (visit 11)      AROM   AROM Assessment Site Shoulder;Knee    Right Shoulder Flexion 109 Degrees    Right Shoulder ABduction 88 Degrees    Right/Left Knee Right    Right Knee Extension 7    Right Knee Flexion 118      Strength   Strength Assessment Site Shoulder;Knee    Right Shoulder Flexion 3-/5    Right Shoulder ABduction 3-/5    Right Shoulder Internal Rotation 3/5    Right Shoulder External Rotation 3/5    Left Shoulder Flexion 4+/5    Left  Shoulder ABduction 4+/5    Left Shoulder Internal Rotation 4+/5    Left Shoulder External Rotation 4+/5    Right/Left Knee Right    Right Knee Flexion 3+/5    Right Knee Extension 4/5      Palpation   Palpation comment TTP over lateral shoulder      Transfers   Five time sit to stand comments  18.6 sec without UE                        Objective measurements completed on examination: See above findings.               PT Education - 10/20/20 0838     Education Details Educated on [employer]    Person(s) Educated Patient    Methods Explanation;Demonstration;Handout    Comprehension Verbalized understanding;Returned demonstration              PT Short Term Goals - 10/20/20 1055       PT SHORT TERM GOAL #1   Title Patient to be independent with initial HEP.    Time 2    Period Weeks    Status New               PT Long Term Goals - 10/20/20 1055       PT LONG TERM GOAL #1   Title Patient to be independent with advanced HEP.    Time 8    Period Weeks    Status New      PT LONG TERM GOAL #2   Title Patient to demonstrate R shoulder and knee strength >/=4+/5.    Time 8    Period Weeks    Status New      PT LONG TERM GOAL #3   Title Patient to demonstrate R shoulder AROM and PROM WFL and with only mild pain remaining.    Time 8    Period Weeks    Status New      PT LONG TERM GOAL #4   Title Patient will have a BERG of at least 50/56 to place her at a low risk of falling.    Time 8    Period Weeks  Status New      PT LONG TERM GOAL #5   Title Patient to report 80% improvement in sleeping tolerance d/t improvement in pain.    Time 8    Period Weeks    Status New                    Plan - 10/20/20 1049     Clinical Impression Statement Patient is a 60 year old female who presents to PT after a month pause with new orders from Dr Jordan Likes secondary to R knee OA and a R glenoid labral tear.  She was initially feeling  better from the steriod injections provided by Dr Jordan Likes, but has just recently started having some increased pain in her R shoulder.  Her PLOF was able to perform all activities independently, but since her fall and injury, she has had increased difficulty.  She presents with R shoulder decreased ROM, muscle weakness of shoulder and knee, difficulty walking, abnormal posture, and increased pain in her shoulder and knee.  She would benefit from skilled outpatient PT to address her functional impairments to allow her to return to her prior independence with her activities.    Personal Factors and Comorbidities Age;Comorbidity 2;Past/Current Experience;Profession;Time since onset of injury/illness/exacerbation    Comorbidities HLD, GERD    Examination-Activity Limitations Sleep;Transfers;Self Feeding;Reach Overhead;Lift;Hygiene/Grooming;Dressing;Carry;Bed Mobility    Examination-Participation Restrictions Cleaning;Community Activity;Driving;Laundry;Meal Prep;Occupation;Yard Work;Shop    Stability/Clinical Decision Making Evolving/Moderate complexity    Clinical Decision Making Moderate    Rehab Potential Good    PT Frequency 2x / week    PT Duration 8 weeks    PT Treatment/Interventions ADLs/Self Care Home Management;Cryotherapy;Electrical Stimulation;Iontophoresis 4mg /ml Dexamethasone;Moist Heat;Therapeutic exercise;Therapeutic activities;Functional mobility training;Ultrasound;Neuromuscular re-education;Patient/family education;Vasopneumatic Device;Taping;Energy conservation;Dry needling;Passive range of motion;Gait training;Stair training;Balance training;Manual techniques;Spinal Manipulations;Joint Manipulations    PT Next Visit Plan progress R shoulder PROM/AAROM/AROM to tolerance; joint mobs for pain    PT Home Exercise Plan Access Code: [email or web address]    Consulted and Agree with Plan of Care Patient             Patient will benefit from skilled therapeutic intervention in order to improve the  following deficits and impairments:  Hypomobility, Increased edema, Decreased activity tolerance, Decreased strength, Pain, Impaired UE functional use, Increased fascial restricitons, Increased muscle spasms, Improper body mechanics, Decreased range of motion, Impaired flexibility, Postural dysfunction, Difficulty walking, Decreased balance  Visit Diagnosis: Acute pain of right shoulder - Plan: PT plan of care cert/re-cert  Stiffness of right shoulder, not elsewhere classified - Plan: PT plan of care cert/re-cert  Muscle weakness (generalized) - Plan: PT plan of care cert/re-cert  Acute pain of right knee - Plan: PT plan of care cert/re-cert  Difficulty in walking, not elsewhere classified - Plan: PT plan of care cert/re-cert  Cramp and spasm - Plan: PT plan of care cert/re-cert  Localized edema - Plan: PT plan of care cert/re-cert  Stiffness of right knee, not elsewhere classified - Plan: PT plan of care cert/re-cert     Problem List Patient Active Problem List   Diagnosis Date Noted   Labral tear of shoulder 06/29/2020   Primary osteoarthritis of right knee 06/29/2020   BREAST MASS, LEFT 11/10/2009   HYPERLIPIDEMIA 10/17/2009   GERD 10/17/2009   CHEST PAIN 10/17/2009    12/18/2009, PT, DPT 10/20/2020, 11:04 AM  [facility] Outpatient Rehabilitation [facility] [address]  [address] [place], [place], [place] Phone: [phone]   Fax:  [phone]  Name: Erica Lindsey MRN: [id number] Date of Birth: [birth year]

## 2020-10-23 ENCOUNTER — Other Ambulatory Visit: Payer: Self-pay

## 2020-10-23 ENCOUNTER — Ambulatory Visit: Payer: 59

## 2020-10-23 DIAGNOSIS — M25611 Stiffness of right shoulder, not elsewhere classified: Secondary | ICD-10-CM

## 2020-10-23 DIAGNOSIS — R262 Difficulty in walking, not elsewhere classified: Secondary | ICD-10-CM

## 2020-10-23 DIAGNOSIS — M25511 Pain in right shoulder: Secondary | ICD-10-CM

## 2020-10-23 DIAGNOSIS — R252 Cramp and spasm: Secondary | ICD-10-CM

## 2020-10-23 DIAGNOSIS — M25661 Stiffness of right knee, not elsewhere classified: Secondary | ICD-10-CM

## 2020-10-23 DIAGNOSIS — M25561 Pain in right knee: Secondary | ICD-10-CM

## 2020-10-23 DIAGNOSIS — M6281 Muscle weakness (generalized): Secondary | ICD-10-CM

## 2020-10-23 DIAGNOSIS — R600 Localized edema: Secondary | ICD-10-CM

## 2020-10-23 NOTE — Therapy (Signed)
[facility] Outpatient Rehabilitation [facility] [address]  [address] [place], [place], [address] Phone: [phone]   Fax:  [phone]  Physical Therapy Treatment  Patient Details  Name: Erica Lindsey MRN: [id number] Date of Birth: [birth year] Referring Provider (PT): Clare Gandy, MD   Encounter Date: 10/23/2020   PT End of Session - 10/23/20 0929     Visit Number 12    Number of Visits 30    Date for PT Re-Evaluation 12/08/20    Authorization Type [employer]    PT Start Time 0847    PT Stop Time 0928    PT Time Calculation (min) 41 min             Past Medical History:  Diagnosis Date   GERD (gastroesophageal reflux disease)    HLD (hyperlipidemia)     Past Surgical History:  Procedure Laterality Date   BLADDER REPAIR W/ CESAREAN SECTION      There were no vitals filed for this visit.   Subjective Assessment - 10/23/20 0848     Subjective R shoulder feels better after the injection. Standing up fast makes her knee unstable.    Pertinent History HLD, GERD    Diagnostic tests 08/13/20 R shoulder MRI: Significant rotator cuff tendinopathy/tendinosis with interstitial tears. No discrete full-thickness retracted tear. Significant thickening and edema of the superior labrum. moderate synovitis and bursitis.    Patient Stated Goals decrease pain    Currently in Pain? Yes    Pain Score 6     Pain Location Shoulder    Pain Orientation Right    Pain Descriptors / Indicators Sore;Aching    Pain Type Chronic pain    Multiple Pain Sites Yes    Pain Score 5    Pain Location Knee    Pain Orientation Right    Pain Descriptors / Indicators Sharp    Pain Type Acute pain                               [facility] Adult PT Treatment/Exercise - 10/23/20 0001       Exercises   Exercises Knee/Hip      Knee/Hip Exercises: Aerobic   Nustep L1x64min      Knee/Hip Exercises: Standing   Hip Flexion Stengthening;Both;10  reps;Knee bent    Hip Flexion Limitations 2#, ski pole assist      Knee/Hip Exercises: Seated   Long Arc Quad Strengthening;Both;10 reps;Weights    Long Arc Quad Weight 2 lbs.    Sit to Sand 10 reps;without UE support   yellow medicine ball at chest     Shoulder Exercises: Seated   Retraction AROM;Both;10 reps    Other Seated Exercises B shoulder rolls both directions 10x      Shoulder Exercises: Standing   Horizontal ABduction Strengthening;Both;10 reps;Theraband    Theraband Level (Shoulder Horizontal ABduction) Level 1 (Yellow)    External Rotation Strengthening;Right;10 reps;Theraband    Theraband Level (Shoulder External Rotation) Level 1 (Yellow)    External Rotation Limitations towel between elbow    Internal Rotation Strengthening;Right;10 reps;Theraband    Theraband Level (Shoulder Internal Rotation) Level 1 (Yellow)    Internal Rotation Limitations towel between elbow    Row Strengthening;Both;10 reps;Theraband    Theraband Level (Shoulder Row) Level 1 (Yellow)    Other Standing Exercises R shoulder flexion and ABD wall slides 10 reps      Manual Therapy  Manual Therapy Passive ROM    Passive ROM flexion, ABD, and ER; slight joint distraction                      PT Short Term Goals - 10/23/20 0930       PT SHORT TERM GOAL #1   Title Patient to be independent with initial HEP.    Time 2    Period Weeks    Status On-going               PT Long Term Goals - 10/23/20 0929       PT LONG TERM GOAL #1   Title Patient to be independent with advanced HEP.    Time 8    Period Weeks    Status On-going      PT LONG TERM GOAL #2   Title Patient to demonstrate R shoulder and knee strength >/=4+/5.    Time 8    Period Weeks    Status On-going      PT LONG TERM GOAL #3   Title Patient to demonstrate R shoulder AROM and PROM WFL and with only mild pain remaining.    Time 8    Period Weeks    Status On-going      PT LONG TERM GOAL #4   Title  Patient will have a BERG of at least 50/56 to place her at a low risk of falling.    Time 8    Period Weeks    Status On-going      PT LONG TERM GOAL #5   Title Patient to report 80% improvement in sleeping tolerance d/t improvement in pain.    Time 8    Period Weeks    Status On-going                   Plan - 10/23/20 0932     Clinical Impression Statement Pt demonstrates a good performance of her HEP but intermittent cues required with resisted ER and rows. She reported that lately she has been having difficulty with straightening her fingers from a flexed postion such as when she brushes her teeth with the R UE. She had fatigue in the R LE with standing marches d/t increased WBing but tolerated well. She demonstrates much improvement so far from injections she got for her R shoulder. Ended session with PROM with her showing increased tolerance.    Personal Factors and Comorbidities Age;Comorbidity 2;Past/Current Experience;Profession;Time since onset of injury/illness/exacerbation    Comorbidities HLD, GERD    PT Frequency 2x / week    PT Duration 8 weeks    PT Treatment/Interventions ADLs/Self Care Home Management;Cryotherapy;Electrical Stimulation;Iontophoresis 4mg /ml Dexamethasone;Moist Heat;Therapeutic exercise;Therapeutic activities;Functional mobility training;Ultrasound;Neuromuscular re-education;Patient/family education;Vasopneumatic Device;Taping;Energy conservation;Dry needling;Passive range of motion;Gait training;Stair training;Balance training;Manual techniques;Spinal Manipulations;Joint Manipulations    PT Next Visit Plan progress R shoulder PROM/AAROM/AROM to tolerance; joint mobs for pain    PT Home Exercise Plan Access Code: [email or web address]    Consulted and Agree with Plan of Care Patient             Patient will benefit from skilled therapeutic intervention in order to improve the following deficits and impairments:  Hypomobility, Increased edema, Decreased  activity tolerance, Decreased strength, Pain, Impaired UE functional use, Increased fascial restricitons, Increased muscle spasms, Improper body mechanics, Decreased range of motion, Impaired flexibility, Postural dysfunction, Difficulty walking, Decreased balance  Visit Diagnosis: Acute pain of right shoulder  Stiffness of right shoulder, not elsewhere  classified  Muscle weakness (generalized)  Acute pain of right knee  Difficulty in walking, not elsewhere classified  Cramp and spasm  Localized edema  Stiffness of right knee, not elsewhere classified     Problem List Patient Active Problem List   Diagnosis Date Noted   Labral tear of shoulder 06/29/2020   Primary osteoarthritis of right knee 06/29/2020   BREAST MASS, LEFT 11/10/2009   HYPERLIPIDEMIA 10/17/2009   GERD 10/17/2009   CHEST PAIN 10/17/2009    Darleene Cleaver, PTA 10/23/2020, 9:45 AM  [facility] [address]  [address] [place], [place], [address] Phone: [phone]   Fax:  [phone]  Name: Alexsandra Shontz MRN: [id number] Date of Birth: [birth year]

## 2020-10-25 ENCOUNTER — Other Ambulatory Visit: Payer: Self-pay

## 2020-10-25 ENCOUNTER — Ambulatory Visit: Payer: 59

## 2020-10-25 DIAGNOSIS — R252 Cramp and spasm: Secondary | ICD-10-CM

## 2020-10-25 DIAGNOSIS — M25511 Pain in right shoulder: Secondary | ICD-10-CM

## 2020-10-25 DIAGNOSIS — M6281 Muscle weakness (generalized): Secondary | ICD-10-CM

## 2020-10-25 DIAGNOSIS — M25611 Stiffness of right shoulder, not elsewhere classified: Secondary | ICD-10-CM

## 2020-10-25 DIAGNOSIS — M25561 Pain in right knee: Secondary | ICD-10-CM

## 2020-10-25 DIAGNOSIS — R600 Localized edema: Secondary | ICD-10-CM

## 2020-10-25 DIAGNOSIS — R262 Difficulty in walking, not elsewhere classified: Secondary | ICD-10-CM

## 2020-10-25 DIAGNOSIS — M25661 Stiffness of right knee, not elsewhere classified: Secondary | ICD-10-CM

## 2020-10-25 NOTE — Therapy (Signed)
[facility] Outpatient Rehabilitation [facility] [address]  [address] [place], [place], [address] Phone: [phone]   Fax:  [phone]  Physical Therapy Treatment  Patient Details  Name: Erica Lindsey MRN: [id number] Date of Birth: 06-Jan-1961 Referring Provider (PT): Clare Gandy, MD   Encounter Date: 10/25/2020   PT End of Session - 10/25/20 0855     Visit Number 13    Number of Visits 30    Date for PT Re-Evaluation 12/08/20    Authorization Type [employer]    PT Start Time 0812    PT Stop Time 0905   10 min of moist heat   PT Time Calculation (min) 53 min    Activity Tolerance Patient tolerated treatment well;Patient limited by pain    Behavior During Therapy [facility] for tasks assessed/performed             Past Medical History:  Diagnosis Date   GERD (gastroesophageal reflux disease)    HLD (hyperlipidemia)     Past Surgical History:  Procedure Laterality Date   BLADDER REPAIR W/ CESAREAN SECTION      There were no vitals filed for this visit.   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    Pertinent History HLD, GERD    Diagnostic tests 08/13/20 R shoulder MRI: Significant rotator cuff tendinopathy/tendinosis with interstitial tears. No discrete full-thickness retracted tear. Significant thickening and edema of the superior labrum. moderate synovitis and bursitis.    Patient Stated Goals decrease pain    Currently in Pain? Yes    Pain Score 4     Pain Location Shoulder    Pain Orientation Right    Pain Descriptors / Indicators Sore;Aching    Pain Type Chronic pain                               [facility] Adult PT Treatment/Exercise - 10/25/20 0001       Knee/Hip Exercises: Aerobic   Nustep L3x53min      Shoulder Exercises: Supine   Protraction Strengthening;Right;20 reps;Weights    Protraction Weight (lbs) 2     Horizontal ABduction Strengthening;Both;10 reps;Theraband    Theraband Level (Shoulder Horizontal ABduction) Level 2 (Red)      Shoulder Exercises: Seated   Row Strengthening;Right;10 reps;Weights    Row Weight (lbs) 5    Row Limitations bent over row      Shoulder Exercises: Standing   External Rotation Strengthening;Right;10 reps;Theraband    Theraband Level (Shoulder External Rotation) Level 2 (Red)    Internal Rotation Strengthening;Right;10 reps;Theraband    Theraband Level (Shoulder Internal Rotation) Level 2 (Red)    Internal Rotation Limitations towel between elbow    Extension Strengthening;Both;20 reps;Theraband    Theraband Level (Shoulder Extension) Level 2 (Red)    Row Strengthening;Both;20 reps;Theraband    Theraband Level (Shoulder Row) Level 2 (Red)    Row Limitations cues for technique      Moist Heat Therapy   Number Minutes Moist Heat 10 Minutes    Moist Heat Location Shoulder      Manual Therapy   Manual Therapy Passive ROM    Manual therapy comments supine    Passive ROM flexion, ABD; slight joint distraction  PT Short Term Goals - 10/23/20 0930       PT SHORT TERM GOAL #1   Title Patient to be independent with initial HEP.    Time 2    Period Weeks    Status On-going               PT Long Term Goals - 10/25/20 0911       PT LONG TERM GOAL #1   Title Patient to be independent with advanced HEP.    Time 8    Period Weeks    Status On-going    Target Date 12/08/20      PT LONG TERM GOAL #2   Title Patient to demonstrate R shoulder and knee strength >/=4+/5.    Time 8    Period Weeks    Status On-going    Target Date 12/08/20      PT LONG TERM GOAL #3   Title Patient to demonstrate R shoulder AROM and PROM WFL and with only mild pain remaining.    Time 8    Period Weeks    Status On-going    Target Date 12/08/20      PT LONG TERM GOAL #4   Title Patient will have a BERG of at least 50/56 to  place her at a low risk of falling.    Time 8    Period Weeks    Status On-going    Target Date 12/08/20      PT LONG TERM GOAL #5   Title Patient to report 80% improvement in sleeping tolerance d/t improvement in pain.    Time 8    Period Weeks    Status On-going    Target Date 12/08/20                   Plan - 10/25/20 0857     Clinical Impression Statement Pt has been doing good since the last visit. 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 She overall did ok with the exercises, lots of cues required with most exercises for proper technique. We increased the resistance with her TB exercises but she c/o mild pain with the resisted ER. After this we did PROM with joint distractions to relieve pain. She requested to use heat on her R shoulder post session, as she states that it works better for her than ice. No complaints at the end of session.    Personal Factors and Comorbidities Age;Comorbidity 2;Past/Current Experience;Profession;Time since onset of injury/illness/exacerbation    Comorbidities HLD, GERD    PT Frequency 2x / week    PT Duration 8 weeks    PT Treatment/Interventions ADLs/Self Care Home Management;Cryotherapy;Electrical Stimulation;Iontophoresis 4mg /ml Dexamethasone;Moist Heat;Therapeutic exercise;Therapeutic activities;Functional mobility training;Ultrasound;Neuromuscular re-education;Patient/family education;Vasopneumatic Device;Taping;Energy conservation;Dry needling;Passive range of motion;Gait training;Stair training;Balance training;Manual techniques;Spinal Manipulations;Joint Manipulations    PT Next Visit Plan RTC and postural strengthening;progress R shoulder PROM/AAROM/AROM to tolerance; joint mobs for pain    PT Home Exercise Plan Access Code: [email or web address]    Consulted and Agree with Plan of Care Patient             Patient will benefit from skilled therapeutic intervention in order to  improve the following deficits and impairments:  Hypomobility, Increased edema, Decreased activity tolerance, Decreased strength, Pain, Impaired UE functional use, Increased fascial restricitons, Increased muscle spasms, Improper body mechanics, Decreased range of motion, Impaired flexibility, Postural dysfunction, Difficulty walking, Decreased balance  Visit Diagnosis: Acute pain of right shoulder  Stiffness of right shoulder, not elsewhere classified  Muscle weakness (generalized)  Acute pain of right knee  Difficulty in walking, not elsewhere classified  Cramp and spasm  Localized edema  Stiffness of right knee, not elsewhere classified     Problem List Patient Active Problem List   Diagnosis Date Noted   Labral tear of shoulder 06/29/2020   Primary osteoarthritis of right knee 06/29/2020   BREAST MASS, LEFT 11/10/2009   HYPERLIPIDEMIA 10/17/2009   GERD 10/17/2009   CHEST PAIN 10/17/2009    Darleene Cleaver, PTA 10/25/2020, 9:13 AM  [facility] [address]  [address] [place], [place], [address] Phone: [phone]   Fax:  [phone]  Name: Erica Lindsey MRN: [id number] Date of Birth: 11-25-1960

## 2020-10-30 ENCOUNTER — Other Ambulatory Visit: Payer: Self-pay

## 2020-10-30 ENCOUNTER — Ambulatory Visit: Payer: 59 | Admitting: Physical Therapy

## 2020-10-30 ENCOUNTER — Encounter: Payer: Self-pay | Admitting: Physical Therapy

## 2020-10-30 DIAGNOSIS — R29898 Other symptoms and signs involving the musculoskeletal system: Secondary | ICD-10-CM

## 2020-10-30 DIAGNOSIS — M6281 Muscle weakness (generalized): Secondary | ICD-10-CM

## 2020-10-30 DIAGNOSIS — M25511 Pain in right shoulder: Secondary | ICD-10-CM | POA: Diagnosis not present

## 2020-10-30 DIAGNOSIS — M25611 Stiffness of right shoulder, not elsewhere classified: Secondary | ICD-10-CM

## 2020-10-30 DIAGNOSIS — R262 Difficulty in walking, not elsewhere classified: Secondary | ICD-10-CM

## 2020-10-30 DIAGNOSIS — R600 Localized edema: Secondary | ICD-10-CM

## 2020-10-30 DIAGNOSIS — M25661 Stiffness of right knee, not elsewhere classified: Secondary | ICD-10-CM

## 2020-10-30 DIAGNOSIS — M25561 Pain in right knee: Secondary | ICD-10-CM

## 2020-10-30 DIAGNOSIS — R252 Cramp and spasm: Secondary | ICD-10-CM

## 2020-10-30 NOTE — Therapy (Signed)
[facility] Outpatient Rehabilitation [facility] [address]  [address] [place], [place], [address] Phone: [phone]   Fax:  [phone]  Physical Therapy Treatment  Patient Details  Name: Erica Lindsey MRN: [id number] Date of Birth: Aug 14, 1960 Referring Provider (PT): Clare Gandy, MD   Encounter Date: 10/30/2020   PT End of Session - 10/30/20 0955     Visit Number 14    Number of Visits 30    Date for PT Re-Evaluation 12/08/20    Authorization Type [employer]    PT Start Time [phone]    PT Stop Time 0940    PT Time Calculation (min) 50 min    Activity Tolerance Patient tolerated treatment well;Patient limited by pain    Behavior During Therapy [facility] for tasks assessed/performed             Past Medical History:  Diagnosis Date   GERD (gastroesophageal reflux disease)    HLD (hyperlipidemia)     Past Surgical History:  Procedure Laterality Date   BLADDER REPAIR W/ CESAREAN SECTION      There were no vitals filed for this visit.   Subjective Assessment - 10/30/20 0854     Subjective Pt. reports her R knee is bothering her more than her shoulder today.  Her shoulder is getting better.    Pertinent History HLD, GERD    Diagnostic tests 08/13/20 R shoulder MRI: Significant rotator cuff tendinopathy/tendinosis with interstitial tears. No discrete full-thickness retracted tear. Significant thickening and edema of the superior labrum. moderate synovitis and bursitis.    Patient Stated Goals decrease pain    Currently in Pain? Yes    Pain Score 5     Pain Location Shoulder    Pain Orientation Right    Pain Score 7    Pain Location Knee    Pain Orientation Right                               [facility] Adult PT Treatment/Exercise - 10/30/20 0001       Exercises   Exercises Knee/Hip;Shoulder      Knee/Hip Exercises: Standing   Heel Raises 20 reps    Lateral Step Up 20 reps;Hand Hold: 1    Lateral Step Up Limitations  no risers    Forward Step Up 20 reps;Hand Hold: 1    Forward Step Up Limitations no risers    Other Standing Knee Exercises dynamic knee flexion stretch on step (1 riser) x 20 prior to step ups      Shoulder Exercises: ROM/Strengthening   Wall Pushups 20 reps    Wall Pushups Limitations demo, distance to tolerance    Other ROM/Strengthening Exercises wall angels, unable on R      Shoulder Exercises: Stretch   Corner Stretch 2 reps    Corner Stretch Limitations R arm only 60 deg    Cross Chest Stretch 3 reps;20 seconds    Cross Chest Stretch Limitations in supine, demo seated for HEP      Moist Heat Therapy   Number Minutes Moist Heat 10 Minutes    Moist Heat Location Shoulder;Knee      Manual Therapy   Manual Therapy Passive ROM;Joint mobilization;Soft tissue mobilization    Joint Mobilization capsule stretch inferior and posterior    Soft tissue mobilization IASTM with stainless steel tools to pectoralis and lats    Passive ROM abduction, IR and ER  PT Short Term Goals - 10/23/20 0930       PT SHORT TERM GOAL #1   Title Patient to be independent with initial HEP.    Time 2    Period Weeks    Status On-going               PT Long Term Goals - 10/25/20 0911       PT LONG TERM GOAL #1   Title Patient to be independent with advanced HEP.    Time 8    Period Weeks    Status On-going    Target Date 12/08/20      PT LONG TERM GOAL #2   Title Patient to demonstrate R shoulder and knee strength >/=4+/5.    Time 8    Period Weeks    Status On-going    Target Date 12/08/20      PT LONG TERM GOAL #3   Title Patient to demonstrate R shoulder AROM and PROM WFL and with only mild pain remaining.    Time 8    Period Weeks    Status On-going    Target Date 12/08/20      PT LONG TERM GOAL #4   Title Patient will have a BERG of at least 50/56 to place her at a low risk of falling.    Time 8    Period Weeks    Status On-going     Target Date 12/08/20      PT LONG TERM GOAL #5   Title Patient to report 80% improvement in sleeping tolerance d/t improvement in pain.    Time 8    Period Weeks    Status On-going    Target Date 12/08/20                   Plan - 10/30/20 0939     Clinical Impression Statement Patient still demonstrates significant limitations in R shoulder ROM, unable to actively abduct to 90 deg today with wall angles, complaining of tightness in pectoralis, lat, serratus muscles, but with PROM in supine had full shoulder ER and IR, but abduction only to 90 deg.  Progressed HEP with posterior capsule stretch and pec stretch to improve ROM.  Pt. would benefit from continued skilled therapy to improve R shoulder pain and ROM.    Personal Factors and Comorbidities Age;Comorbidity 2;Past/Current Experience;Profession;Time since onset of injury/illness/exacerbation    Comorbidities HLD, GERD    PT Frequency 2x / week    PT Duration 8 weeks    PT Treatment/Interventions ADLs/Self Care Home Management;Cryotherapy;Electrical Stimulation;Iontophoresis 4mg /ml Dexamethasone;Moist Heat;Therapeutic exercise;Therapeutic activities;Functional mobility training;Ultrasound;Neuromuscular re-education;Patient/family education;Vasopneumatic Device;Taping;Energy conservation;Dry needling;Passive range of motion;Gait training;Stair training;Balance training;Manual techniques;Spinal Manipulations;Joint Manipulations    PT Next Visit Plan RTC and postural strengthening;progress R shoulder PROM/AAROM/AROM to tolerance; joint mobs for pain    PT Home Exercise Plan Access Code: [email or web address] (updated 10/30/20)    Consulted and Agree with Plan of Care Patient             Patient will benefit from skilled therapeutic intervention in order to improve the following deficits and impairments:  Hypomobility, Increased edema, Decreased activity tolerance, Decreased strength, Pain, Impaired UE functional use, Increased fascial  restricitons, Increased muscle spasms, Improper body mechanics, Decreased range of motion, Impaired flexibility, Postural dysfunction, Difficulty walking, Decreased balance  Visit Diagnosis: Acute pain of right shoulder  Stiffness of right shoulder, not elsewhere classified  Muscle weakness (generalized)  Acute pain of right knee  Difficulty  in walking, not elsewhere classified  Cramp and spasm  Localized edema  Stiffness of right knee, not elsewhere classified  Other symptoms and signs involving the musculoskeletal system     Problem List Patient Active Problem List   Diagnosis Date Noted   Labral tear of shoulder 06/29/2020   Primary osteoarthritis of right knee 06/29/2020   BREAST MASS, LEFT 11/10/2009   HYPERLIPIDEMIA 10/17/2009   GERD 10/17/2009   CHEST PAIN 10/17/2009    Erica Lindsey PT, DPT 10/30/2020, 10:02 AM  [facility] [address]  [address] [place], [place], [address] Phone: [phone]   Fax:  [phone]  Name: Erica Lindsey MRN: [id number] Date of Birth: 1960-07-03

## 2020-11-02 ENCOUNTER — Encounter: Payer: Self-pay | Admitting: Physical Therapy

## 2020-11-02 ENCOUNTER — Ambulatory Visit: Payer: 59 | Admitting: Physical Therapy

## 2020-11-02 ENCOUNTER — Other Ambulatory Visit: Payer: Self-pay

## 2020-11-02 DIAGNOSIS — R262 Difficulty in walking, not elsewhere classified: Secondary | ICD-10-CM

## 2020-11-02 DIAGNOSIS — M25561 Pain in right knee: Secondary | ICD-10-CM

## 2020-11-02 DIAGNOSIS — M25511 Pain in right shoulder: Secondary | ICD-10-CM | POA: Diagnosis not present

## 2020-11-02 DIAGNOSIS — R600 Localized edema: Secondary | ICD-10-CM

## 2020-11-02 DIAGNOSIS — M25611 Stiffness of right shoulder, not elsewhere classified: Secondary | ICD-10-CM

## 2020-11-02 DIAGNOSIS — M6281 Muscle weakness (generalized): Secondary | ICD-10-CM

## 2020-11-02 DIAGNOSIS — R252 Cramp and spasm: Secondary | ICD-10-CM

## 2020-11-02 NOTE — Therapy (Signed)
[facility] Outpatient Rehabilitation [facility] [address]  [address] [place], [place], [address] Phone: [phone]   Fax:  [phone]  Physical Therapy Treatment  Patient Details  Name: Erica Lindsey MRN: [id number] Date of Birth: 1960/09/28 Referring Provider (PT): Clare Gandy, MD   Encounter Date: 11/02/2020   PT End of Session - 11/02/20 0805     Visit Number 15    Number of Visits 30    Date for PT Re-Evaluation 12/08/20    Authorization Type [employer]    PT Start Time 0801    PT Stop Time 0856    PT Time Calculation (min) 55 min    Activity Tolerance Patient tolerated treatment well;Patient limited by pain    Behavior During Therapy [facility] for tasks assessed/performed             Past Medical History:  Diagnosis Date   GERD (gastroesophageal reflux disease)    HLD (hyperlipidemia)     Past Surgical History:  Procedure Laterality Date   BLADDER REPAIR W/ CESAREAN SECTION      There were no vitals filed for this visit.   Subjective Assessment - 11/02/20 0804     Subjective Pt. reports a little pain in her R shoulder.  Her knee is better.  No new concerns or changes reported.    Pertinent History HLD, GERD    Diagnostic tests 08/13/20 R shoulder MRI: Significant rotator cuff tendinopathy/tendinosis with interstitial tears. No discrete full-thickness retracted tear. Significant thickening and edema of the superior labrum. moderate synovitis and bursitis.    Patient Stated Goals decrease pain    Currently in Pain? Yes    Pain Score 4     Pain Location Shoulder    Pain Orientation Right                               [facility] Adult PT Treatment/Exercise - 11/02/20 0001       Exercises   Exercises Shoulder      Shoulder Exercises: Seated   Other Seated Exercises I,T, T's 3 x 5 reps 1#      Shoulder Exercises: Standing   Horizontal ABduction Strengthening;Both;10 reps;Theraband    External Rotation  Strengthening;10 reps;Theraband;Both    Theraband Level (Shoulder External Rotation) Level 2 (Red)    Diagonals Strengthening;Right;20 reps;Theraband    Theraband Level (Shoulder Diagonals) Level 2 (Red)      Shoulder Exercises: ROM/Strengthening   Nustep L5 x 6 min    Wall Wash 2 x 10, scaption    Wall Pushups 20 reps    Wall Pushups Limitations demo, distance to tolerance      Modalities   Modalities Cryotherapy      Cryotherapy   Number Minutes Cryotherapy 10 Minutes    Cryotherapy Location Shoulder    Type of Cryotherapy Ice pack      Manual Therapy   Manual Therapy Soft tissue mobilization;Other (comment)   Dry needling   Soft tissue mobilization TPR to pectoralis, subscap and lat              Trigger Point Dry Needling - 11/02/20 0001     Consent Given? Yes    Education Handout Provided Yes    Muscles Treated Upper Quadrant Subscapularis;Latissimus dorsi;Pectoralis major    Pectoralis Major Response Twitch response elicited;Palpable increased muscle length    Subscapularis Response Twitch response elicited;Palpable increased muscle length  Latissimus dorsi Response Palpable increased muscle length;Twitch response elicited                  PT Education - 11/02/20 1016     Education Details education on dry needling    Person(s) Educated Patient    Methods Explanation;Demonstration;Handout    Comprehension Verbalized understanding              PT Short Term Goals - 10/23/20 0930       PT SHORT TERM GOAL #1   Title Patient to be independent with initial HEP.    Time 2    Period Weeks    Status On-going               PT Long Term Goals - 10/25/20 0911       PT LONG TERM GOAL #1   Title Patient to be independent with advanced HEP.    Time 8    Period Weeks    Status On-going    Target Date 12/08/20      PT LONG TERM GOAL #2   Title Patient to demonstrate R shoulder and knee strength >/=4+/5.    Time 8    Period Weeks     Status On-going    Target Date 12/08/20      PT LONG TERM GOAL #3   Title Patient to demonstrate R shoulder AROM and PROM WFL and with only mild pain remaining.    Time 8    Period Weeks    Status On-going    Target Date 12/08/20      PT LONG TERM GOAL #4   Title Patient will have a BERG of at least 50/56 to place her at a low risk of falling.    Time 8    Period Weeks    Status On-going    Target Date 12/08/20      PT LONG TERM GOAL #5   Title Patient to report 80% improvement in sleeping tolerance d/t improvement in pain.    Time 8    Period Weeks    Status On-going    Target Date 12/08/20                   Plan - 11/02/20 1003     Clinical Impression Statement Patient was complaining of more R shoulder pain today after living heavy box at home, and reported more discomfort today with shoulder strengthening exercises, especially any above 90 deg.  She reported pain more in pecs/subscap and lats today with manual therapy, and asked about dry needling.  I offered medical translator services again at this point while explaining dry needling, and she declined stating that she understood.  She tolerated dry needling well today and reported decreased tightness in R shoulder following.  Educated that some soreness would be expected for 24 hours, continue ROM and exercises as tolerated and can apply heat or ice if needed.  CP applied to shoulder following and she reported decreased pain at end of session.    Personal Factors and Comorbidities Age;Comorbidity 2;Past/Current Experience;Profession;Time since onset of injury/illness/exacerbation    Comorbidities HLD, GERD    PT Frequency 2x / week    PT Duration 8 weeks    PT Treatment/Interventions ADLs/Self Care Home Management;Cryotherapy;Electrical Stimulation;Iontophoresis 4mg /ml Dexamethasone;Moist Heat;Therapeutic exercise;Therapeutic activities;Functional mobility training;Ultrasound;Neuromuscular re-education;Patient/family  education;Vasopneumatic Device;Taping;Energy conservation;Dry needling;Passive range of motion;Gait training;Stair training;Balance training;Manual techniques;Spinal Manipulations;Joint Manipulations    PT Next Visit Plan RTC and postural strengthening;progress R shoulder  PROM/AAROM/AROM to tolerance; joint mobs for pain    PT Home Exercise Plan Access Code: [email or web address] (updated 10/30/20)    Consulted and Agree with Plan of Care Patient             Patient will benefit from skilled therapeutic intervention in order to improve the following deficits and impairments:  Hypomobility, Increased edema, Decreased activity tolerance, Decreased strength, Pain, Impaired UE functional use, Increased fascial restricitons, Increased muscle spasms, Improper body mechanics, Decreased range of motion, Impaired flexibility, Postural dysfunction, Difficulty walking, Decreased balance  Visit Diagnosis: Acute pain of right shoulder  Stiffness of right shoulder, not elsewhere classified  Muscle weakness (generalized)  Acute pain of right knee  Difficulty in walking, not elsewhere classified  Cramp and spasm  Localized edema     Problem List Patient Active Problem List   Diagnosis Date Noted   Labral tear of shoulder 06/29/2020   Primary osteoarthritis of right knee 06/29/2020   BREAST MASS, LEFT 11/10/2009   HYPERLIPIDEMIA 10/17/2009   GERD 10/17/2009   CHEST PAIN 10/17/2009    Jena Gauss PT, DPT   11/02/2020, 10:19 AM  [facility] [address]  [address] [place], [place], [address] Phone: [phone]   Fax:  [phone]  Name: Erica Lindsey MRN: [id number] Date of Birth: 21-Sep-1960

## 2020-11-02 NOTE — Patient Instructions (Signed)

## 2020-11-07 ENCOUNTER — Other Ambulatory Visit: Payer: Self-pay

## 2020-11-07 ENCOUNTER — Ambulatory Visit: Payer: 59 | Attending: Family Medicine

## 2020-11-07 DIAGNOSIS — R252 Cramp and spasm: Secondary | ICD-10-CM | POA: Diagnosis present

## 2020-11-07 DIAGNOSIS — R262 Difficulty in walking, not elsewhere classified: Secondary | ICD-10-CM | POA: Insufficient documentation

## 2020-11-07 DIAGNOSIS — M25511 Pain in right shoulder: Secondary | ICD-10-CM | POA: Diagnosis present

## 2020-11-07 DIAGNOSIS — M25661 Stiffness of right knee, not elsewhere classified: Secondary | ICD-10-CM | POA: Diagnosis present

## 2020-11-07 DIAGNOSIS — M25611 Stiffness of right shoulder, not elsewhere classified: Secondary | ICD-10-CM | POA: Insufficient documentation

## 2020-11-07 DIAGNOSIS — M6281 Muscle weakness (generalized): Secondary | ICD-10-CM | POA: Insufficient documentation

## 2020-11-07 DIAGNOSIS — M25561 Pain in right knee: Secondary | ICD-10-CM | POA: Diagnosis present

## 2020-11-07 DIAGNOSIS — R29898 Other symptoms and signs involving the musculoskeletal system: Secondary | ICD-10-CM | POA: Diagnosis present

## 2020-11-07 DIAGNOSIS — R600 Localized edema: Secondary | ICD-10-CM | POA: Diagnosis present

## 2020-11-07 NOTE — Therapy (Signed)
[facility] Outpatient Rehabilitation [facility] [address]  [address] [place], [place], [address] Phone: [phone]   Fax:  [phone]  Physical Therapy Treatment  Patient Details  Name: Erica Lindsey MRN: [id number] Date of Birth: 06/04/1960 Referring Provider (PT): Clare Gandy, MD   Encounter Date: 11/07/2020   PT End of Session - 11/07/20 0846     Visit Number 16    Number of Visits 30    Date for PT Re-Evaluation 12/08/20    Authorization Type [employer]    PT Start Time 0800    PT Stop Time 0856    PT Time Calculation (min) 56 min    Activity Tolerance Patient tolerated treatment well;Patient limited by pain    Behavior During Therapy [facility] for tasks assessed/performed             Past Medical History:  Diagnosis Date   GERD (gastroesophageal reflux disease)    HLD (hyperlipidemia)     Past Surgical History:  Procedure Laterality Date   BLADDER REPAIR W/ CESAREAN SECTION      There were no vitals filed for this visit.   Subjective Assessment - 11/07/20 0802     Subjective Have been working very hard at work these past couple of weeks. A lot of pushing and pulling along with walking. The DN from last session helped a lot.    Pertinent History HLD, GERD    Diagnostic tests 08/13/20 R shoulder MRI: Significant rotator cuff tendinopathy/tendinosis with interstitial tears. No discrete full-thickness retracted tear. Significant thickening and edema of the superior labrum. moderate synovitis and bursitis.    Patient Stated Goals decrease pain    Currently in Pain? Yes    Pain Score 4     Pain Location Shoulder    Pain Orientation Right    Pain Descriptors / Indicators Sore    Pain Type Chronic pain                               [facility] Adult PT Treatment/Exercise - 11/07/20 0001       Exercises   Exercises Shoulder;Knee/Hip      Knee/Hip Exercises: Aerobic   Nustep L5X58min      Knee/Hip Exercises:  Seated   Ball Squeeze 20x3"      Shoulder Exercises: Seated   Flexion Strengthening;Right;10 reps;Theraband    Theraband Level (Shoulder Flexion) Level 1 (Yellow)    Abduction Strengthening;Right;10 reps;Theraband    Theraband Level (Shoulder ABduction) Level 1 (Yellow)      Shoulder Exercises: Standing   Horizontal ABduction Strengthening;Both;10 reps;Theraband    Theraband Level (Shoulder Horizontal ABduction) Level 2 (Red)    External Rotation Strengthening;Right;10 reps;Theraband    Theraband Level (Shoulder External Rotation) Level 2 (Red)    Internal Rotation Strengthening;Right;10 reps;Theraband    Theraband Level (Shoulder Internal Rotation) Level 2 (Red)    Extension Strengthening;Both;10 reps;Theraband    Theraband Level (Shoulder Extension) Level 2 (Red)      Shoulder Exercises: ROM/Strengthening   Wall Pushups 10 reps    Wall Pushups Limitations serratus push up    Ball on Wall standing B shoulder flexion with red weight ball and back against wall 2x10    Other ROM/Strengthening Exercises standing open book on wall, R/L 10x   limited by pain on R side     Shoulder Exercises: Stretch   Other Shoulder Stretches low pec stretch 3x30"  Moist Heat Therapy   Number Minutes Moist Heat 10 Minutes    Moist Heat Location Shoulder                      PT Short Term Goals - 11/07/20 0815       PT SHORT TERM GOAL #1   Title Patient to be independent with initial HEP.    Time 2    Period Weeks    Status Achieved               PT Long Term Goals - 10/25/20 0911       PT LONG TERM GOAL #1   Title Patient to be independent with advanced HEP.    Time 8    Period Weeks    Status On-going    Target Date 12/08/20      PT LONG TERM GOAL #2   Title Patient to demonstrate R shoulder and knee strength >/=4+/5.    Time 8    Period Weeks    Status On-going    Target Date 12/08/20      PT LONG TERM GOAL #3   Title Patient to demonstrate R shoulder  AROM and PROM WFL and with only mild pain remaining.    Time 8    Period Weeks    Status On-going    Target Date 12/08/20      PT LONG TERM GOAL #4   Title Patient will have a BERG of at least 50/56 to place her at a low risk of falling.    Time 8    Period Weeks    Status On-going    Target Date 12/08/20      PT LONG TERM GOAL #5   Title Patient to report 80% improvement in sleeping tolerance d/t improvement in pain.    Time 8    Period Weeks    Status On-going    Target Date 12/08/20                   Plan - 11/07/20 0848     Clinical Impression Statement Pt noted that she has been seeing improvements with her shoulder and knee. The DN from last session reportedly helped with decreasing muscle stiffness in her R shoulder complex. Pt requires moderate cues with exercises for proper positioning and to target the correct muscles. She has intermittent pain with movements away from the body (ABD, ER). REviewed her HEP today to ensure understanding, she demonstrated a good performance of her exercises. Ended session with moist heat to shoulder to decrease pain and muscle stiffness. She would continue to benefit from from postural strengthening and hip strength to improve her functional abilities.    Personal Factors and Comorbidities Age;Comorbidity 2;Past/Current Experience;Profession;Time since onset of injury/illness/exacerbation    Comorbidities HLD, GERD    PT Frequency 2x / week    PT Duration 8 weeks    PT Treatment/Interventions ADLs/Self Care Home Management;Cryotherapy;Electrical Stimulation;Iontophoresis 4mg /ml Dexamethasone;Moist Heat;Therapeutic exercise;Therapeutic activities;Functional mobility training;Ultrasound;Neuromuscular re-education;Patient/family education;Vasopneumatic Device;Taping;Energy conservation;Dry needling;Passive range of motion;Gait training;Stair training;Balance training;Manual techniques;Spinal Manipulations;Joint Manipulations    PT Next  Visit Plan RTC and postural strengthening; hip strengthening    PT Home Exercise Plan Access Code: [email or web address] (updated 10/30/20)    Consulted and Agree with Plan of Care Patient             Patient will benefit from skilled therapeutic intervention in order to improve the following deficits and impairments:  Hypomobility, Increased  edema, Decreased activity tolerance, Decreased strength, Pain, Impaired UE functional use, Increased fascial restricitons, Increased muscle spasms, Improper body mechanics, Decreased range of motion, Impaired flexibility, Postural dysfunction, Difficulty walking, Decreased balance  Visit Diagnosis: Acute pain of right shoulder  Stiffness of right shoulder, not elsewhere classified  Muscle weakness (generalized)  Acute pain of right knee  Difficulty in walking, not elsewhere classified  Cramp and spasm  Localized edema  Stiffness of right knee, not elsewhere classified  Other symptoms and signs involving the musculoskeletal system     Problem List Patient Active Problem List   Diagnosis Date Noted   Labral tear of shoulder 06/29/2020   Primary osteoarthritis of right knee 06/29/2020   BREAST MASS, LEFT 11/10/2009   HYPERLIPIDEMIA 10/17/2009   GERD 10/17/2009   CHEST PAIN 10/17/2009    Darleene Cleaver, PTA 11/07/2020, 12:01 PM  [facility] Outpatient Rehabilitation [facility] [address]  [address] [place], [place], [address] Phone: [phone]   Fax:  [phone]  Name: Erica Lindsey MRN: [id number] Date of Birth: [birth year]

## 2020-11-09 ENCOUNTER — Ambulatory Visit: Payer: 59 | Admitting: Physical Therapy

## 2020-11-09 ENCOUNTER — Other Ambulatory Visit: Payer: Self-pay

## 2020-11-09 ENCOUNTER — Encounter: Payer: Self-pay | Admitting: Physical Therapy

## 2020-11-09 DIAGNOSIS — R252 Cramp and spasm: Secondary | ICD-10-CM

## 2020-11-09 DIAGNOSIS — M25611 Stiffness of right shoulder, not elsewhere classified: Secondary | ICD-10-CM

## 2020-11-09 DIAGNOSIS — M25561 Pain in right knee: Secondary | ICD-10-CM

## 2020-11-09 DIAGNOSIS — R262 Difficulty in walking, not elsewhere classified: Secondary | ICD-10-CM

## 2020-11-09 DIAGNOSIS — M25661 Stiffness of right knee, not elsewhere classified: Secondary | ICD-10-CM

## 2020-11-09 DIAGNOSIS — R600 Localized edema: Secondary | ICD-10-CM

## 2020-11-09 DIAGNOSIS — M25511 Pain in right shoulder: Secondary | ICD-10-CM

## 2020-11-09 DIAGNOSIS — R29898 Other symptoms and signs involving the musculoskeletal system: Secondary | ICD-10-CM

## 2020-11-09 DIAGNOSIS — M6281 Muscle weakness (generalized): Secondary | ICD-10-CM

## 2020-11-09 NOTE — Therapy (Signed)
[facility] [address]  [address] [place], [place], [address] Phone: [phone]   Fax:  [phone]  Physical Therapy Treatment  Patient Details  Name: Erica Lindsey MRN: [id number] Date of Birth: October 15, 1960 Referring Provider (PT): Clare Gandy, MD   Encounter Date: 11/09/2020   PT End of Session - 11/09/20 1002     Visit Number 16    Number of Visits 30    Date for PT Re-Evaluation 12/08/20    Authorization Type [employer]    PT Start Time [phone]    PT Stop Time 0925    PT Time Calculation (min) 38 min    Activity Tolerance Patient tolerated treatment well;Patient limited by pain    Behavior During Therapy [facility] for tasks assessed/performed             Past Medical History:  Diagnosis Date   GERD (gastroesophageal reflux disease)    HLD (hyperlipidemia)     Past Surgical History:  Procedure Laterality Date   BLADDER REPAIR W/ CESAREAN SECTION      There were no vitals filed for this visit.   Subjective Assessment - 11/09/20 0851     Subjective Pt. reports she was doing better but then her  R shoulder started to hurt a lot last night.    Pertinent History HLD, GERD    Limitations Lifting;House hold activities    Diagnostic tests 08/13/20 R shoulder MRI: Significant rotator cuff tendinopathy/tendinosis with interstitial tears. No discrete full-thickness retracted tear. Significant thickening and edema of the superior labrum. moderate synovitis and bursitis.    Patient Stated Goals decrease pain    Currently in Pain? Yes    Pain Score 6     Pain Location Shoulder    Pain Orientation Right    Pain Onset 1 to 4 weeks ago                               [facility] Adult PT Treatment/Exercise - 11/09/20 0001       Exercises   Exercises Shoulder      Shoulder Exercises: ROM/Strengthening   UBE (Upper Arm Bike) x 6 min retro      Modalities   Modalities --   declined     Manual  Therapy   Manual therapy comments supine    Joint Mobilization capsule stretch inferior and posterior    Soft tissue mobilization TPR to pectoralis, subscap and lat    Passive ROM abduction, IR and ER                      PT Short Term Goals - 11/07/20 0815       PT SHORT TERM GOAL #1   Title Patient to be independent with initial HEP.    Time 2    Period Weeks    Status Achieved               PT Long Term Goals - 10/25/20 0911       PT LONG TERM GOAL #1   Title Patient to be independent with advanced HEP.    Time 8    Period Weeks    Status On-going    Target Date 12/08/20      PT LONG TERM GOAL #2   Title Patient to demonstrate R shoulder and knee strength >/=4+/5.    Time 8  Period Weeks    Status On-going    Target Date 12/08/20      PT LONG TERM GOAL #3   Title Patient to demonstrate R shoulder AROM and PROM WFL and with only mild pain remaining.    Time 8    Period Weeks    Status On-going    Target Date 12/08/20      PT LONG TERM GOAL #4   Title Patient will have a BERG of at least 50/56 to place her at a low risk of falling.    Time 8    Period Weeks    Status On-going    Target Date 12/08/20      PT LONG TERM GOAL #5   Title Patient to report 80% improvement in sleeping tolerance d/t improvement in pain.    Time 8    Period Weeks    Status On-going    Target Date 12/08/20                   Plan - 11/09/20 1002     Clinical Impression Statement Pt. reported increased R shoulder pain, very painful gaurded movements and poor tolerance to exercises.  She declined offer to DN today although she did find it helpful in previous session.  focus of today's session was manual therapy to decrease pain and improve ROM.  Noted tenderness especially in pectoralis and middle delt, as well as subscap.  She reported decreased pain/tightness following interventions today and declined modalities.  Pt. would benefit from continued skilled  therapy.    Personal Factors and Comorbidities Age;Comorbidity 2;Past/Current Experience;Profession;Time since onset of injury/illness/exacerbation    Comorbidities HLD, GERD    PT Frequency 2x / week    PT Duration 8 weeks    PT Treatment/Interventions ADLs/Self Care Home Management;Cryotherapy;Electrical Stimulation;Iontophoresis 4mg /ml Dexamethasone;Moist Heat;Therapeutic exercise;Therapeutic activities;Functional mobility training;Ultrasound;Neuromuscular re-education;Patient/family education;Vasopneumatic Device;Taping;Energy conservation;Dry needling;Passive range of motion;Gait training;Stair training;Balance training;Manual techniques;Spinal Manipulations;Joint Manipulations    PT Next Visit Plan RTC and postural strengthening; hip strengthening    PT Home Exercise Plan Access Code: [email or web address] (updated 10/30/20)    Consulted and Agree with Plan of Care Patient             Patient will benefit from skilled therapeutic intervention in order to improve the following deficits and impairments:  Hypomobility, Increased edema, Decreased activity tolerance, Decreased strength, Pain, Impaired UE functional use, Increased fascial restricitons, Increased muscle spasms, Improper body mechanics, Decreased range of motion, Impaired flexibility, Postural dysfunction, Difficulty walking, Decreased balance  Visit Diagnosis: Acute pain of right shoulder  Stiffness of right shoulder, not elsewhere classified  Muscle weakness (generalized)  Acute pain of right knee  Difficulty in walking, not elsewhere classified  Cramp and spasm  Localized edema  Stiffness of right knee, not elsewhere classified  Other symptoms and signs involving the musculoskeletal system     Problem List Patient Active Problem List   Diagnosis Date Noted   Labral tear of shoulder 06/29/2020   Primary osteoarthritis of right knee 06/29/2020   BREAST MASS, LEFT 11/10/2009   HYPERLIPIDEMIA 10/17/2009   GERD  10/17/2009   CHEST PAIN 10/17/2009    12/18/2009 PT, DPT 11/09/2020, 10:08 AM  [facility] [address]  [address] [place], [place], [place] Phone: [phone]   Fax:  [phone]  Name: Erica Lindsey MRN: Loni Beckwith Date of Birth: 12/03/1960

## 2020-11-13 ENCOUNTER — Ambulatory Visit (INDEPENDENT_AMBULATORY_CARE_PROVIDER_SITE_OTHER): Payer: 59 | Admitting: Family Medicine

## 2020-11-13 ENCOUNTER — Encounter: Payer: Self-pay | Admitting: Family Medicine

## 2020-11-13 ENCOUNTER — Other Ambulatory Visit: Payer: Self-pay

## 2020-11-13 DIAGNOSIS — M1711 Unilateral primary osteoarthritis, right knee: Secondary | ICD-10-CM | POA: Diagnosis not present

## 2020-11-13 DIAGNOSIS — S43431D Superior glenoid labrum lesion of right shoulder, subsequent encounter: Secondary | ICD-10-CM

## 2020-11-13 NOTE — Patient Instructions (Signed)
Good to see you Please try to continue physical therapy   Please use the pennsaid as needed Please send me a message in MyChart with any questions or updates.  Please see me back in 8 weeks.   --Dr. Jordan Likes

## 2020-11-13 NOTE — Assessment & Plan Note (Signed)
Doing well since the injection.  Pain only occurs intermittently. -Counseled on home exercise therapy and supportive care. -Can finish out physical therapy. -Provided Pennsaid samples.

## 2020-11-13 NOTE — Progress Notes (Signed)
Medication Samples have been provided to the patient.  Drug name: Pennsaid       Strength: 2%        Qty: 2 boxes LOT: [id number]  Exp.Date: 10/2021  Dosing instructions: use a pea size amount  The patient has been instructed regarding the correct time, dose, and frequency of taking this medication, including desired effects and most common side effects.   Erica Lindsey 9:11 AM 11/13/2020

## 2020-11-13 NOTE — Progress Notes (Signed)
  [name] - 60 y.o. female MRN [id number]  Date of birth: 1960-10-06  SUBJECTIVE:  Including CC & ROS.  No chief complaint on file.   Erica Lindsey is a 60 y.o. female that is following up for her knee and shoulder pain.  She reports the pain being intermittent in nature.  Has been doing well physical therapy and since the injections.Mervyn Skeeters [place] phone interpreter was used for this visit   Review of Systems See HPI   HISTORY: Past Medical, Surgical, Social, and Family History Reviewed & Updated per EMR.   Pertinent Historical Findings include:  Past Medical History:  Diagnosis Date   GERD (gastroesophageal reflux disease)    HLD (hyperlipidemia)     Past Surgical History:  Procedure Laterality Date   BLADDER REPAIR W/ CESAREAN SECTION      Family History  Problem Relation Age of Onset   Hypertension Mother    Diabetes Mother    Stroke Father    Heart attack Father     Social History   Socioeconomic History   Marital status: Married    Spouse name: Not on file   Number of children: Not on file   Years of education: Not on file   Highest education level: Not on file  Occupational History   Not on file  Tobacco Use   Smoking status: Never   Smokeless tobacco: Never   Tobacco comments:    tobacco use- no   Substance and Sexual Activity   Alcohol use: No   Drug use: Never   Sexual activity: Not on file  Other Topics Concern   Not on file  Social History Narrative   ** Merged History Encounter **       Social Determinants of Health   Financial Resource Strain: Not on file  Food Insecurity: Not on file  Transportation Needs: Not on file  Physical Activity: Not on file  Stress: Not on file  Social Connections: Not on file  Intimate Partner Violence: Not on file     PHYSICAL EXAM:  VS: BP 118/78 (BP Location: Left Arm, Patient Position: Sitting, Cuff Size: Large)   Ht 5\' 4"  (1.626 m)   Wt 205 lb (93 kg)   BMI 35.19 kg/m   Physical Exam Gen: NAD, alert, cooperative with exam, well-appearing    ASSESSMENT & PLAN:   Labral tear of shoulder Doing well since the injection. -Counseled on home exercise therapy and supportive care. -Can finish out physical therapy.   Primary osteoarthritis of right knee Doing well since the injection.  Pain only occurs intermittently. -Counseled on home exercise therapy and supportive care. -Can finish out physical therapy. -Provided Pennsaid samples.

## 2020-11-13 NOTE — Assessment & Plan Note (Signed)
Doing well since the injection. -Counseled on home exercise therapy and supportive care. -Can finish out physical therapy.

## 2020-11-14 ENCOUNTER — Encounter: Payer: 59 | Admitting: Physical Therapy

## 2020-11-15 ENCOUNTER — Other Ambulatory Visit: Payer: Self-pay | Admitting: Family Medicine

## 2020-11-22 ENCOUNTER — Other Ambulatory Visit: Payer: Self-pay

## 2020-11-22 ENCOUNTER — Ambulatory Visit: Payer: 59 | Admitting: Physical Therapy

## 2020-11-22 ENCOUNTER — Encounter: Payer: Self-pay | Admitting: Physical Therapy

## 2020-11-22 DIAGNOSIS — M25661 Stiffness of right knee, not elsewhere classified: Secondary | ICD-10-CM

## 2020-11-22 DIAGNOSIS — M25561 Pain in right knee: Secondary | ICD-10-CM

## 2020-11-22 DIAGNOSIS — M6281 Muscle weakness (generalized): Secondary | ICD-10-CM

## 2020-11-22 DIAGNOSIS — R29898 Other symptoms and signs involving the musculoskeletal system: Secondary | ICD-10-CM

## 2020-11-22 DIAGNOSIS — M25511 Pain in right shoulder: Secondary | ICD-10-CM

## 2020-11-22 DIAGNOSIS — R252 Cramp and spasm: Secondary | ICD-10-CM

## 2020-11-22 DIAGNOSIS — R262 Difficulty in walking, not elsewhere classified: Secondary | ICD-10-CM

## 2020-11-22 DIAGNOSIS — R600 Localized edema: Secondary | ICD-10-CM

## 2020-11-22 DIAGNOSIS — M25611 Stiffness of right shoulder, not elsewhere classified: Secondary | ICD-10-CM

## 2020-11-22 NOTE — Therapy (Signed)
[facility]patient Rehabilitation [facility] [address]  [address] [place], [place], [address] Phone: [phone]   Fax:  [phone]  Physical Therapy Treatment  Patient Details  Name: Erica Lindsey MRN: [id number] Date of Birth: 1960-09-15 Referring Provider (PT): Clare Gandy, MD   Encounter Date: 11/22/2020   PT End of Session - 11/22/20 0852     Visit Number 17    Number of Visits 30    Date for PT Re-Evaluation 12/08/20    Authorization Type [employer]    PT Start Time [phone]   Pt arrived late   PT Stop Time 0930    PT Time Calculation (min) 38 min    Activity Tolerance Patient tolerated treatment well;Patient limited by pain    Behavior During Therapy [facility] for tasks assessed/performed             Past Medical History:  Diagnosis Date   GERD (gastroesophageal reflux disease)    HLD (hyperlipidemia)     Past Surgical History:  Procedure Laterality Date   BLADDER REPAIR W/ CESAREAN SECTION      There were no vitals filed for this visit.   Subjective Assessment - 11/22/20 0856     Subjective Pt reports MD wants her to continue with PT.    Pertinent History HLD, GERD    Limitations Lifting;House hold activities    Diagnostic tests 08/13/20 R shoulder MRI: Significant rotator cuff tendinopathy/tendinosis with interstitial tears. No discrete full-thickness retracted tear. Significant thickening and edema of the superior labrum. moderate synovitis and bursitis.    Patient Stated Goals decrease pain    Currently in Pain? Yes    Pain Location Shoulder    Pain Orientation Right    Pain Descriptors / Indicators Tightness    Pain Type Chronic pain    Pain Frequency Intermittent    Pain Score 4    Pain Location Knee    Pain Orientation Right    Pain Descriptors / Indicators Other (Comment)   "swollen"   Pain Frequency Intermittent                [facility] PT Assessment - 11/22/20 0852       Assessment   Medical Diagnosis R  glenoid labrum tear, R knee OA    Referring Provider (PT) Clare Gandy, MD    Next MD Visit 01/12/21      AROM   Right Shoulder Flexion 138 Degrees    Right Shoulder ABduction 108 Degrees    Right Knee Extension 2    Right Knee Flexion 134                           [facility] Adult PT Treatment/Exercise - 11/22/20 0852       Ambulation/Gait   Ambulation/Gait Assistance 5: Supervision    Ambulation/Gait Assistance Details Cues for increase hip and knee flexion with heel strike on weight acceptance to promote normal gait pattern with improved foot clearance and better knee stability.    Ambulation Distance (Feet) 180 Feet    Assistive device None      Knee/Hip Exercises: Standing   Hip Flexion Both;10 reps;Stengthening;Knee bent    Hip Flexion Limitations red TB march; UE support on counter    Terminal Knee Extension Right;20 reps;Strengthening    Terminal Knee Extension Limitations ball on wall    Hip Abduction Right;Left;10 reps;Stengthening;Knee straight    Abduction Limitations looped red TB at ankles;  UE support on counter - cues to keep trunk upright    Hip Extension Right;Left;10 reps;Stengthening;Knee straight    Extension Limitations looped red TB at ankles; UE support on counter - cues to keep trunk upright    Functional Squat 10 reps;3 seconds    Functional Squat Limitations counter squat + red TB hip ABD isometric    Other Standing Knee Exercises B side-stepping with looped red TB at ankles 2 x 10 ft along counter    Other Standing Knee Exercises Alt toe clears to 9" step x 20; Alt fwd step-over 1/2 FR & back x 20 with focus on heel strike      Shoulder Exercises: ROM/Strengthening   UBE (Upper Arm Bike) L2.0 x 6 min (3' fwd/3' back)                      PT Short Term Goals - 11/07/20 0815       PT SHORT TERM GOAL #1   Title Patient to be independent with initial HEP.    Time 2    Period Weeks    Status Achieved                PT Long Term Goals - 11/22/20 0901       PT LONG TERM GOAL #1   Title Patient to be independent with advanced HEP.    Time 8    Period Weeks    Status On-going    Target Date 12/08/20      PT LONG TERM GOAL #2   Title Patient to demonstrate R shoulder and knee strength >/=4+/5.    Time 8    Period Weeks    Status On-going    Target Date 12/08/20      PT LONG TERM GOAL #3   Title Patient to demonstrate R shoulder AROM and PROM WFL and with only mild pain remaining.    Time 8    Period Weeks    Status On-going    Target Date 12/08/20      PT LONG TERM GOAL #4   Title Patient will have a BERG of at least 50/56 to place her at a low risk of falling.    Time 8    Period Weeks    Status On-going    Target Date 12/08/20      PT LONG TERM GOAL #5   Title Patient to report 80% improvement in sleeping tolerance d/t improvement in pain.    Time 8    Period Weeks    Status On-going    Target Date 12/08/20                   Plan - 11/22/20 0930     Clinical Impression Statement Erica Lindsey reports MD indicating he wants her to continue with PT as of her f/u visit last week. She has demonstrated improvements in both her R shoulder and knee ROM but still notes tightness in her shoulder and intermittent edema at her knee. She also notes ongoing tendency for her R knee to buckle as well as tendency to feel like she is catching her foot on the floor both of which cause her to feel like she is stumbling, therefore focused today's therapeutic exercises on proximal LE strengthening to improve knee stability as well as activities to promote improved foot clearance with gait to reduce tripping hazard due to catching her toes while walking.    Comorbidities HLD, GERD  Rehab Potential Good    PT Frequency 2x / week    PT Duration 8 weeks    PT Treatment/Interventions ADLs/Self Care Home Management;Cryotherapy;Electrical Stimulation;Iontophoresis 4mg /ml Dexamethasone;Moist Heat;Therapeutic  exercise;Therapeutic activities;Functional mobility training;Ultrasound;Neuromuscular re-education;Patient/family education;Vasopneumatic Device;Taping;Energy conservation;Dry needling;Passive range of motion;Gait training;Stair training;Balance training;Manual techniques;Spinal Manipulations;Joint Manipulations    PT Next Visit Plan RTC and postural strengthening; hip/LE strengthening    PT Home Exercise Plan Access Code: [email or web address] (updated 10/30/20)    Consulted and Agree with Plan of Care Patient             Patient will benefit from skilled therapeutic intervention in order to improve the following deficits and impairments:  Hypomobility, Increased edema, Decreased activity tolerance, Decreased strength, Pain, Impaired UE functional use, Increased fascial restricitons, Increased muscle spasms, Improper body mechanics, Decreased range of motion, Impaired flexibility, Postural dysfunction, Difficulty walking, Decreased balance  Visit Diagnosis: Acute pain of right shoulder  Stiffness of right shoulder, not elsewhere classified  Muscle weakness (generalized)  Acute pain of right knee  Difficulty in walking, not elsewhere classified  Cramp and spasm  Localized edema  Stiffness of right knee, not elsewhere classified  Other symptoms and signs involving the musculoskeletal system     Problem List Patient Active Problem List   Diagnosis Date Noted   Labral tear of shoulder 06/29/2020   Primary osteoarthritis of right knee 06/29/2020   BREAST MASS, LEFT 11/10/2009   HYPERLIPIDEMIA 10/17/2009   GERD 10/17/2009   CHEST PAIN 10/17/2009    12/18/2009, PT, MPT 11/22/2020, 2:58 PM  [facility] Outpatient Rehabilitation [facility] [address]  [address] [place], [place], [place] Phone: [phone]   Fax:  [phone]  Name: Erica Lindsey MRN: Loni Beckwith Date of Birth: 06-10-1960

## 2020-11-29 ENCOUNTER — Ambulatory Visit: Payer: 59

## 2020-11-29 ENCOUNTER — Other Ambulatory Visit: Payer: Self-pay

## 2020-11-29 DIAGNOSIS — M25511 Pain in right shoulder: Secondary | ICD-10-CM

## 2020-11-29 DIAGNOSIS — M25611 Stiffness of right shoulder, not elsewhere classified: Secondary | ICD-10-CM

## 2020-11-29 DIAGNOSIS — R29898 Other symptoms and signs involving the musculoskeletal system: Secondary | ICD-10-CM

## 2020-11-29 DIAGNOSIS — M25561 Pain in right knee: Secondary | ICD-10-CM

## 2020-11-29 DIAGNOSIS — R252 Cramp and spasm: Secondary | ICD-10-CM

## 2020-11-29 DIAGNOSIS — R262 Difficulty in walking, not elsewhere classified: Secondary | ICD-10-CM

## 2020-11-29 DIAGNOSIS — R600 Localized edema: Secondary | ICD-10-CM

## 2020-11-29 DIAGNOSIS — M25661 Stiffness of right knee, not elsewhere classified: Secondary | ICD-10-CM

## 2020-11-29 DIAGNOSIS — M6281 Muscle weakness (generalized): Secondary | ICD-10-CM

## 2020-11-29 NOTE — Therapy (Signed)
[facility] Outpatient Rehabilitation [facility] [address]  [address] [place], [place], [address] Phone: [phone]   Fax:  [phone]  Physical Therapy Treatment  Patient Details  Name: Erica Lindsey MRN: [id number] Date of Birth: 12-11-1960 Referring Provider (PT): Clare Gandy, MD   Encounter Date: 11/29/2020   PT End of Session - 11/29/20 0931     Visit Number 18    Number of Visits 30    Date for PT Re-Evaluation 12/08/20    Authorization Type [employer]    PT Start Time 0848    PT Stop Time 0939   10 min of moist heat   PT Time Calculation (min) 51 min    Activity Tolerance Patient tolerated treatment well;Patient limited by pain    Behavior During Therapy [facility] for tasks assessed/performed             Past Medical History:  Diagnosis Date   GERD (gastroesophageal reflux disease)    HLD (hyperlipidemia)     Past Surgical History:  Procedure Laterality Date   BLADDER REPAIR W/ CESAREAN SECTION      There were no vitals filed for this visit.   Subjective Assessment - 11/29/20 0851     Subjective Did not feel pain for 4 days before today.    Pertinent History HLD, GERD    Diagnostic tests 08/13/20 R shoulder MRI: Significant rotator cuff tendinopathy/tendinosis with interstitial tears. No discrete full-thickness retracted tear. Significant thickening and edema of the superior labrum. moderate synovitis and bursitis.    Patient Stated Goals decrease pain    Currently in Pain? Yes    Pain Score 5     Pain Location Shoulder    Pain Orientation Right    Pain Descriptors / Indicators Tightness    Pain Type Chronic pain    Pain Score 5    Pain Location Knee    Pain Orientation Right    Pain Descriptors / Indicators Other (Comment)   "swollen"   Pain Type Acute pain                [facility] PT Assessment - 11/29/20 0001       Strength   Right Shoulder Flexion 4+/5   mild pain   Right Shoulder ABduction 4/5   mild pain    Right Shoulder Internal Rotation 4+/5    Right Shoulder External Rotation 4/5    Right Knee Flexion 4-/5   mild pain   Right Knee Extension 4/5   mild pain                          [facility] Adult PT Treatment/Exercise - 11/29/20 0001       Knee/Hip Exercises: Machines for Strengthening   Cybex Knee Flexion 15# 10 reps B LE      Knee/Hip Exercises: Standing   Knee Flexion Strengthening;Right;10 reps    Knee Flexion Limitations 2#; counter support    Hip Abduction Stengthening;Right;10 reps;Knee straight    Abduction Limitations 2#;counter support    Hip Extension Stengthening;Right;10 reps;Knee straight    Extension Limitations 2# counter support      Knee/Hip Exercises: Seated   Marching Strengthening;Both;10 reps    Marching Limitations yellow loop at feet      Shoulder Exercises: Seated   External Rotation Strengthening;Both;10 reps;Theraband    Theraband Level (Shoulder External Rotation) Level 2 (Red)    Flexion Strengthening;Right;10 reps;Theraband    Theraband Level (  Shoulder Flexion) Level 1 (Yellow)    Flexion Limitations 2x10      Moist Heat Therapy   Number Minutes Moist Heat 10 Minutes    Moist Heat Location Shoulder                      PT Short Term Goals - 11/07/20 0815       PT SHORT TERM GOAL #1   Title Patient to be independent with initial HEP.    Time 2    Period Weeks    Status Achieved               PT Long Term Goals - 11/29/20 3614       PT LONG TERM GOAL #1   Title Patient to be independent with advanced HEP.    Time 8    Period Weeks    Status On-going      PT LONG TERM GOAL #2   Title Patient to demonstrate R shoulder and knee strength >/=4+/5.    Time 8    Period Weeks    Status On-going   still limited with knee strength, shoulder abduction and ER     PT LONG TERM GOAL #3   Title Patient to demonstrate R shoulder AROM and PROM WFL and with only mild pain remaining.    Time 8    Period Weeks     Status On-going      PT LONG TERM GOAL #4   Title Patient will have a BERG of at least 50/56 to place her at a low risk of falling.    Time 8    Period Weeks    Status On-going      PT LONG TERM GOAL #5   Title Patient to report 80% improvement in sleeping tolerance d/t improvement in pain.    Time 8    Period Weeks    Status On-going                   Plan - 11/29/20 0931     Clinical Impression Statement Pt demonstrates limitations still remaining in UE and LE strength. She reported pain with resisted shoulder flexion but only mild. I talked with her about focusing a little more on strengthening to LEs. Gave cues during exercises as needed. She demonstrated a good performance of the exercises after being instructed. Pt responded well.    Personal Factors and Comorbidities Age;Comorbidity 2;Past/Current Experience;Profession;Time since onset of injury/illness/exacerbation    Comorbidities HLD, GERD    PT Frequency 2x / week    PT Duration 8 weeks    PT Treatment/Interventions ADLs/Self Care Home Management;Cryotherapy;Electrical Stimulation;Iontophoresis 4mg /ml Dexamethasone;Moist Heat;Therapeutic exercise;Therapeutic activities;Functional mobility training;Ultrasound;Neuromuscular re-education;Patient/family education;Vasopneumatic Device;Taping;Energy conservation;Dry needling;Passive range of motion;Gait training;Stair training;Balance training;Manual techniques;Spinal Manipulations;Joint Manipulations    PT Next Visit Plan RTC and postural strengthening; hip/LE strengthening    PT Home Exercise Plan Access Code: [email or web address] (updated 10/30/20)    Consulted and Agree with Plan of Care Patient             Patient will benefit from skilled therapeutic intervention in order to improve the following deficits and impairments:  Hypomobility, Increased edema, Decreased activity tolerance, Decreased strength, Pain, Impaired UE functional use, Increased fascial restricitons,  Increased muscle spasms, Improper body mechanics, Decreased range of motion, Impaired flexibility, Postural dysfunction, Difficulty walking, Decreased balance  Visit Diagnosis: Acute pain of right shoulder  Stiffness of right shoulder, not elsewhere classified  Muscle weakness (generalized)  Acute pain of right knee  Difficulty in walking, not elsewhere classified  Cramp and spasm  Localized edema  Stiffness of right knee, not elsewhere classified  Other symptoms and signs involving the musculoskeletal system     Problem List Patient Active Problem List   Diagnosis Date Noted   Labral tear of shoulder 06/29/2020   Primary osteoarthritis of right knee 06/29/2020   BREAST MASS, LEFT 11/10/2009   HYPERLIPIDEMIA 10/17/2009   GERD 10/17/2009   CHEST PAIN 10/17/2009    Darleene Cleaver, PTA 11/29/2020, 9:58 AM  [facility] [address]  [address] [place], [place], [address] Phone: [phone]   Fax:  [phone]  Name: Philisha Weinel MRN: [id number] Date of Birth: [birth year]

## 2021-01-10 ENCOUNTER — Ambulatory Visit: Payer: 59 | Admitting: Physical Therapy

## 2021-01-12 ENCOUNTER — Ambulatory Visit: Payer: 59 | Admitting: Family Medicine

## 2021-01-15 ENCOUNTER — Ambulatory Visit (INDEPENDENT_AMBULATORY_CARE_PROVIDER_SITE_OTHER): Payer: 59 | Admitting: Family Medicine

## 2021-01-15 DIAGNOSIS — S43431D Superior glenoid labrum lesion of right shoulder, subsequent encounter: Secondary | ICD-10-CM

## 2021-01-15 NOTE — Assessment & Plan Note (Signed)
Pain is intermittent in nature and still more the center of her pain. -Counseled on home exercise therapy and supportive care. -Can place a hold on physical therapy. -Could consider injection

## 2021-01-15 NOTE — Progress Notes (Signed)
  Erica Lindsey - 60 y.o. female MRN [id number]  Date of birth: 1961/03/27  SUBJECTIVE:  Including CC & ROS.  No chief complaint on file.   Erica Lindsey is a 60 y.o. female that is following up for her right shoulder pain.  She has done well with physical therapy.  Still has pain intermittently and does not feel completely back to 100%.    Review of Systems See HPI   HISTORY: Past Medical, Surgical, Social, and Family History Reviewed & Updated per EMR.   Pertinent Historical Findings include:  Past Medical History:  Diagnosis Date   GERD (gastroesophageal reflux disease)    HLD (hyperlipidemia)     Past Surgical History:  Procedure Laterality Date   BLADDER REPAIR W/ CESAREAN SECTION      Family History  Problem Relation Age of Onset   Hypertension Mother    Diabetes Mother    Stroke Father    Heart attack Father     Social History   Socioeconomic History   Marital status: Married    Spouse name: Not on file   Number of children: Not on file   Years of education: Not on file   Highest education level: Not on file  Occupational History   Not on file  Tobacco Use   Smoking status: Never   Smokeless tobacco: Never   Tobacco comments:    tobacco use- no   Substance and Sexual Activity   Alcohol use: No   Drug use: Never   Sexual activity: Not on file  Other Topics Concern   Not on file  Social History Narrative   ** Merged History Encounter **       Social Determinants of Health   Financial Resource Strain: Not on file  Food Insecurity: Not on file  Transportation Needs: Not on file  Physical Activity: Not on file  Stress: Not on file  Social Connections: Not on file  Intimate Partner Violence: Not on file     PHYSICAL EXAM:  VS: Ht 5\' 4"  (1.626 m)   Wt 220 lb (99.8 kg)   BMI 37.76 kg/m  Physical Exam Gen: NAD, alert, cooperative with exam, well-appearing      ASSESSMENT & PLAN:   Labral tear of shoulder Pain is  intermittent in nature and still more the center of her pain. -Counseled on home exercise therapy and supportive care. -Can place a hold on physical therapy. -Could consider injection

## 2021-01-15 NOTE — Patient Instructions (Signed)
Good to see you Please use heat before exercises and ice after  You can place a hold on physical therapy   Please send me a message in MyChart with any questions or updates.  Please see me back in 6-8 weeks.   --Dr. Jordan Likes

## 2021-01-29 ENCOUNTER — Ambulatory Visit (INDEPENDENT_AMBULATORY_CARE_PROVIDER_SITE_OTHER): Payer: 59 | Admitting: Family Medicine

## 2021-01-29 ENCOUNTER — Ambulatory Visit: Payer: Self-pay

## 2021-01-29 VITALS — BP 100/78 | Ht 64.0 in | Wt 208.0 lb

## 2021-01-29 DIAGNOSIS — S43431D Superior glenoid labrum lesion of right shoulder, subsequent encounter: Secondary | ICD-10-CM | POA: Diagnosis not present

## 2021-01-29 DIAGNOSIS — S83261A Peripheral tear of lateral meniscus, current injury, right knee, initial encounter: Secondary | ICD-10-CM

## 2021-01-29 DIAGNOSIS — M25561 Pain in right knee: Secondary | ICD-10-CM

## 2021-01-29 MED ORDER — TRIAMCINOLONE ACETONIDE 40 MG/ML IJ SUSP
40.0000 mg | Freq: Once | INTRAMUSCULAR | Status: AC
Start: 2021-01-29 — End: 2021-01-29
  Administered 2021-01-29: 40 mg via INTRA_ARTICULAR

## 2021-01-29 NOTE — Progress Notes (Signed)
Erica Lindsey - 60 y.o. female MRN [id number]  Date of birth: 06-28-1960  SUBJECTIVE:  Including CC & ROS.  No chief complaint on file.   Erica Lindsey is a 60 y.o. female that is presenting with acute right knee pain after twisting the knee a few days ago.  Presenting with acute on chronic right shoulder pain that is exacerbated with lying on the affected side.    Review of Systems See HPI   HISTORY: Past Medical, Surgical, Social, and Family History Reviewed & Updated per EMR.   Pertinent Historical Findings include:  Past Medical History:  Diagnosis Date   GERD (gastroesophageal reflux disease)    HLD (hyperlipidemia)     Past Surgical History:  Procedure Laterality Date   BLADDER REPAIR W/ CESAREAN SECTION      Family History  Problem Relation Age of Onset   Hypertension Mother    Diabetes Mother    Stroke Father    Heart attack Father     Social History   Socioeconomic History   Marital status: Married    Spouse name: Not on file   Number of children: Not on file   Years of education: Not on file   Highest education level: Not on file  Occupational History   Not on file  Tobacco Use   Smoking status: Never   Smokeless tobacco: Never   Tobacco comments:    tobacco use- no   Substance and Sexual Activity   Alcohol use: No   Drug use: Never   Sexual activity: Not on file  Other Topics Concern   Not on file  Social History Narrative   ** Merged History Encounter **       Social Determinants of Health   Financial Resource Strain: Not on file  Food Insecurity: Not on file  Transportation Needs: Not on file  Physical Activity: Not on file  Stress: Not on file  Social Connections: Not on file  Intimate Partner Violence: Not on file     PHYSICAL EXAM:  VS: BP 100/78   Ht 5\' 4"  (1.626 m)   Wt 208 lb (94.3 kg)   BMI 35.70 kg/m  Physical Exam Gen: NAD, alert, cooperative with exam, well-appearing   Limited ultrasound: Right  knee:  No effusion suprapatellar pouch. Normal-appearing quadricep patellar tendon. Increased hyperemia over the lateral meniscus.  Summary: Acute changes of the lateral meniscus  Ultrasound and interpretation by , MD   Aspiration/Injection Procedure Note Erica Lindsey 1961/03/23  Procedure: Injection Indications: Right shoulder pain  Procedure Details Consent: Risks of procedure as well as the alternatives and risks of each were explained to the (patient/caregiver).  Consent for procedure obtained. Time Out: Verified patient identification, verified procedure, site/side was marked, verified correct patient position, special equipment/implants available, medications/allergies/relevent history reviewed, required imaging and test results available.  Performed.  The area was cleaned with iodine and alcohol swabs.    The right glenohumeral joint was injected using 3 cc of 1% lidocaine on a 22-gauge 3-1/2 inch needle.  The syringe was switched to mixture containing 1 cc's of 40 mg Kenalog and 4 cc's of 0.25% bupivacaine was injected.  Ultrasound was used. Images were obtained in short views showing the injection.     A sterile dressing was applied.  Patient did tolerate procedure well.     ASSESSMENT & PLAN:   Peripheral tear of lateral meniscus of right knee as current injury Acute change of knee symptoms  are more associated with the meniscus -Counseled on home exercise therapy and supportive care. -Hinged knee brace. -Could consider physical therapy.  Labral tear of shoulder Acute on chronic in nature.  Has acutely worsened over the past few weeks. -Counseled on home exercise therapy and supportive care. -Injection today. -Could consider PRP.

## 2021-01-29 NOTE — Assessment & Plan Note (Signed)
Acute change of knee symptoms are more associated with the meniscus -Counseled on home exercise therapy and supportive care. -Hinged knee brace. -Could consider physical therapy.

## 2021-01-29 NOTE — Assessment & Plan Note (Signed)
Acute on chronic in nature.  Has acutely worsened over the past few weeks. -Counseled on home exercise therapy and supportive care. -Injection today. -Could consider PRP.

## 2021-01-29 NOTE — Patient Instructions (Signed)
Good to see you Please try the ice  Please try the brace   Please send me a message in MyChart with any questions or updates.  Please see me back in 4 weeks.   --Dr. Jordan Likes

## 2021-03-05 ENCOUNTER — Ambulatory Visit: Payer: 59 | Admitting: Family Medicine

## 2021-03-12 ENCOUNTER — Ambulatory Visit: Payer: 59 | Admitting: Family Medicine

## 2022-05-25 IMAGING — MR MR SHOULDER*R* W/O CM
4 of 5 series · 27 of 40 positions shown · non-contrast
Comparison: Radiographs 06/18/2020

CLINICAL DATA: Right shoulder pain and limited range of motion for
2 months. No specific injury.

EXAM:
MRI OF THE RIGHT SHOULDER WITHOUT CONTRAST
TECHNIQUE: Multiplanar, multisequence MR imaging of the shoulder was performed.
No intravenous contrast was administered.

[Series 6: T2 fat-sat · axial · right · 3.0mm · 0.47mm/px · z∈[-39,+60]mm · 11 of 27 slices shown (1 of 3)]
[im 1/27]
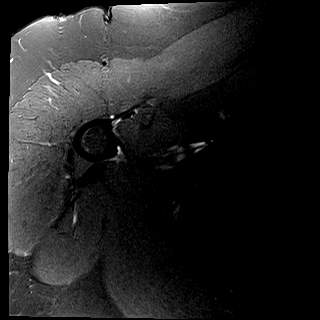
[im 3/27]
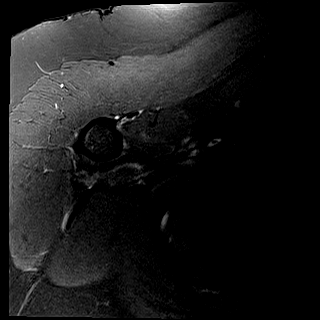
[im 6/27]
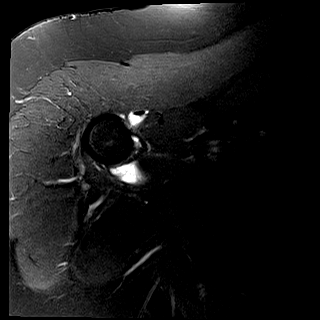
[im 8/27]
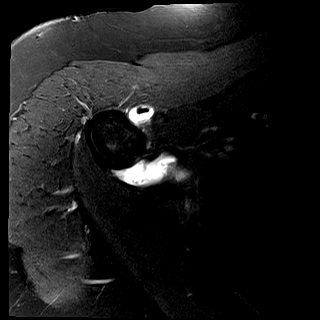
[im 11/27]
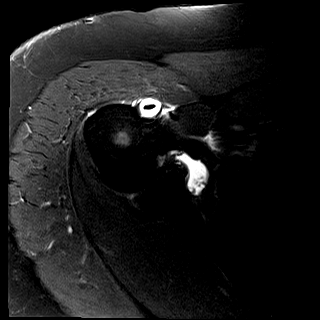
[im 14/27]
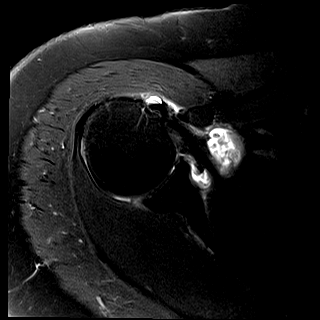
[im 16/27]
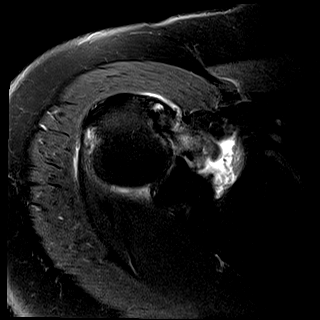
[im 19/27]
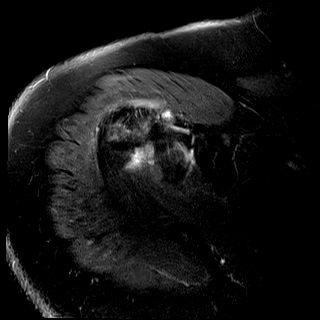
[im 21/27]
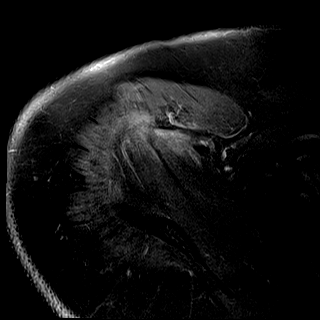
[im 24/27]
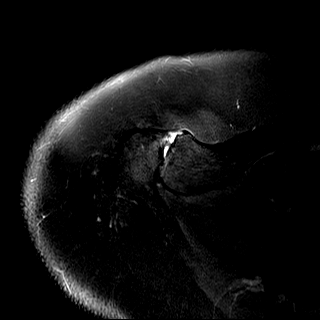
[im 27/27]
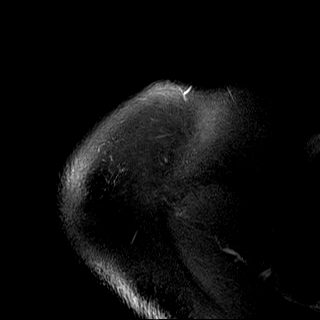

[Series 7: T2 fat-sat · oblique · right · 4.0mm · 0.27mm/px · 6 of 19 slices shown (2 of 3)]
[im 1/19]
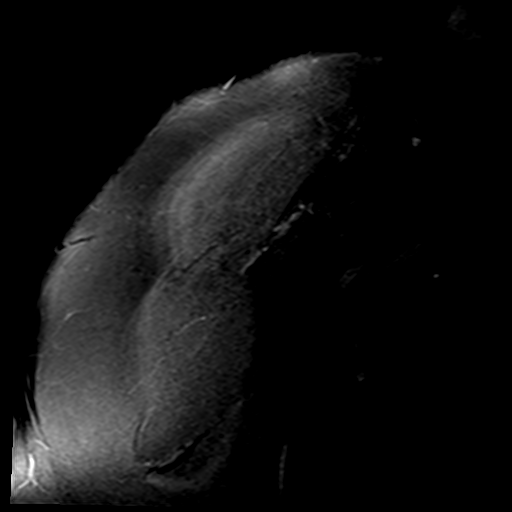
[im 3/19]
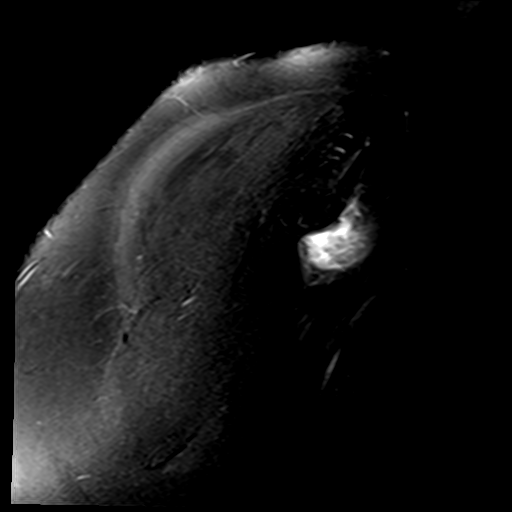
[im 6/19]
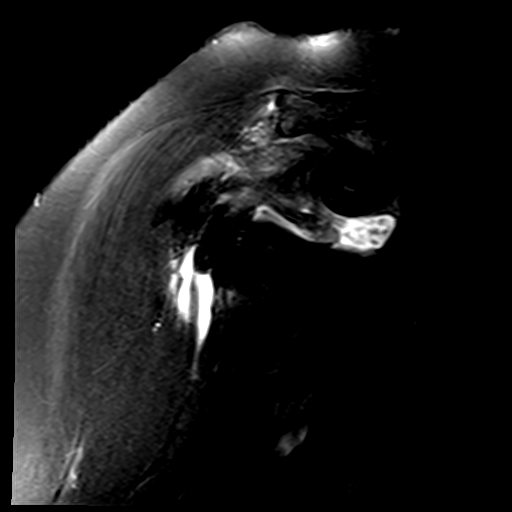
[im 8/19]
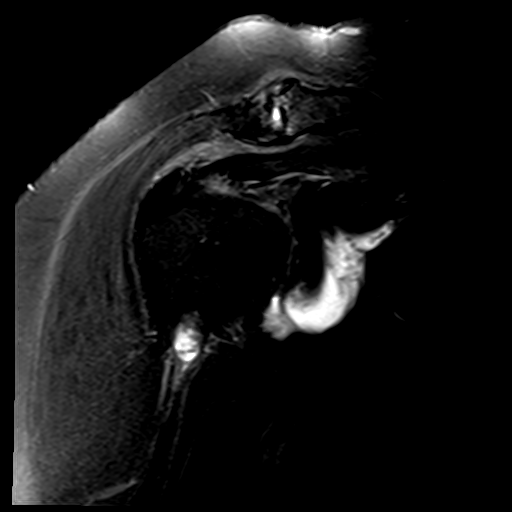
[im 11/19]
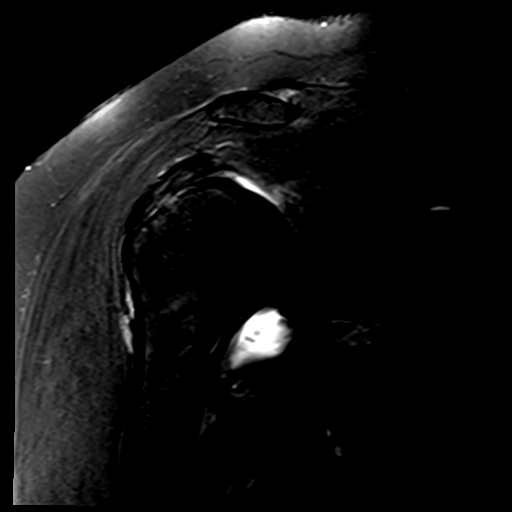
[im 16/19]
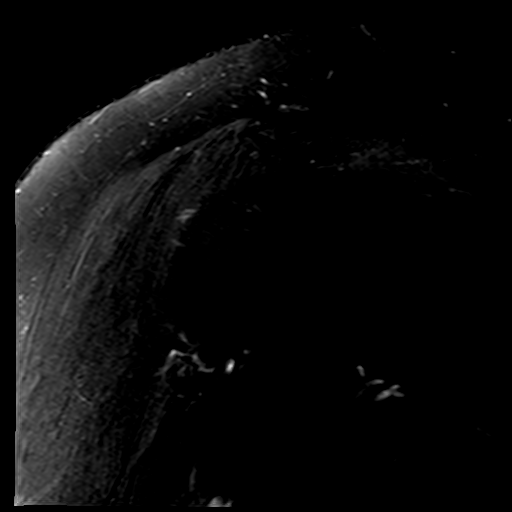

[Series 8: PD · oblique · right · 4.0mm · 0.27mm/px · 7 of 19 slices shown]
[im 1/19]
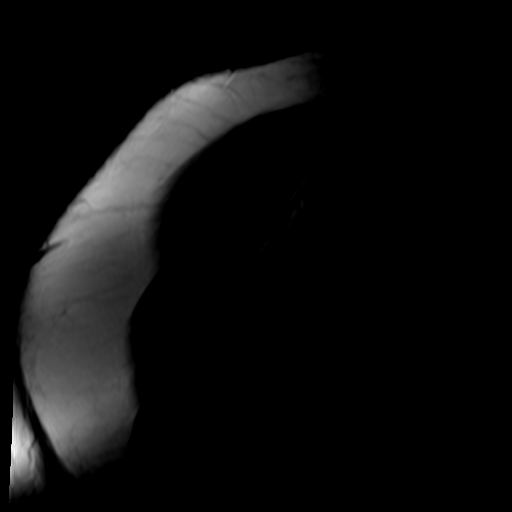
[im 4/19]
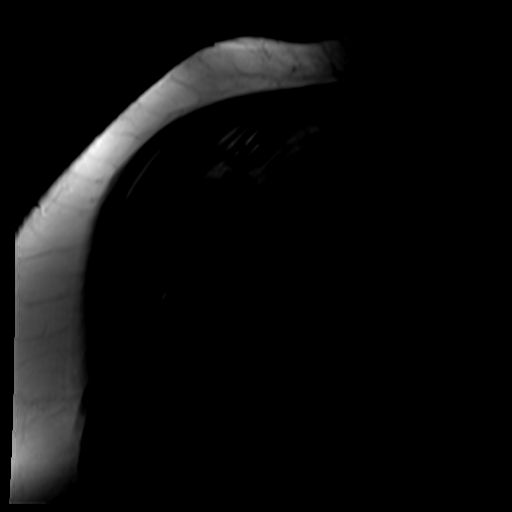
[im 7/19]
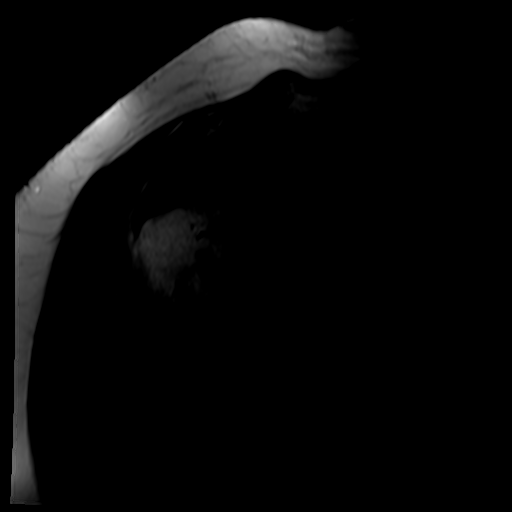
[im 10/19]
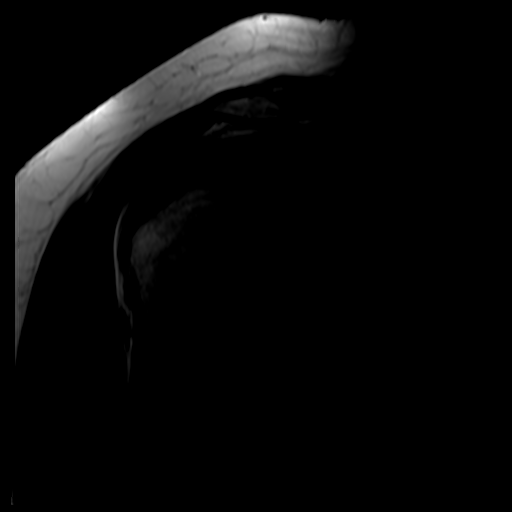
[im 13/19]
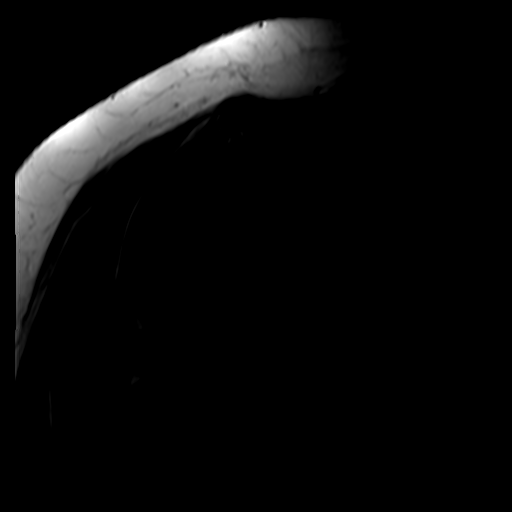
[im 16/19]
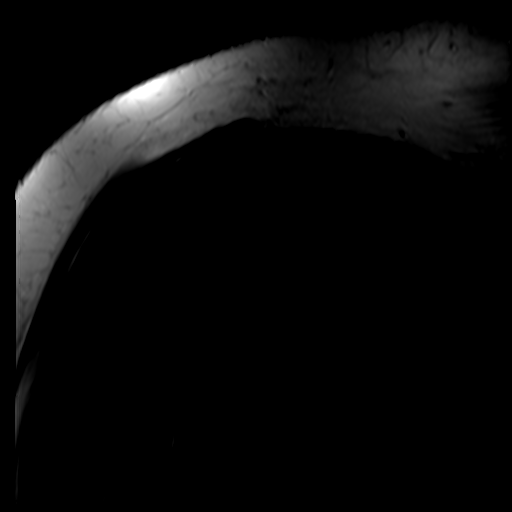
[im 19/19]
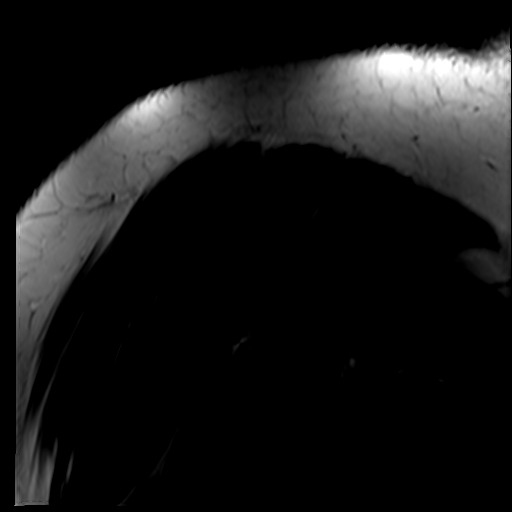

[Series 9: T2 fat-sat · oblique · right · 4.0mm · 0.44mm/px · 3 of 19 slices shown (3 of 3)]
[im 4/19]
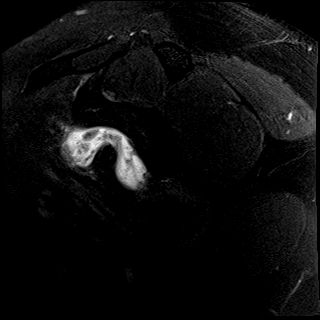
[im 10/19]
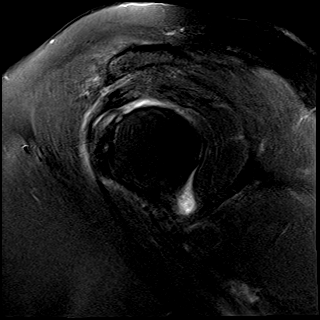
[im 16/19]
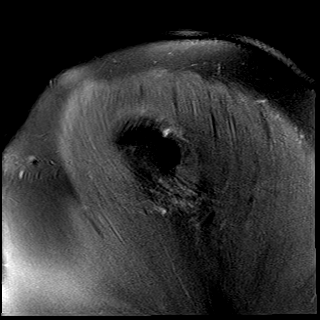

[27 of 40 positions shown; findings below may reference images not displayed]

FINDINGS: Exam is somewhat degraded by motion artifact.

Rotator cuff: Significant rotator cuff tendinopathy/tendinosis. The
tendons are thickened and appear edematous with fairly significant
interstitial tears. No discrete full-thickness retracted tear is
identified.

Muscles:  No significant findings.

Biceps long head: Intact but significant tendinopathy with probable
interstitial tears. There is also marked thickening and edema like
signal changes involving the region of the biceps anchor.

Acromioclavicular Joint: Mild degenerative changes. Type -3
acromion. No lateral downsloping or subacromial spurring.

Glenohumeral Joint: Mild degenerative changes. Moderate-sized joint
effusion and synovitis. There is also moderate subcoracoid bursitis
and significant inflammatory debris or synovitis.

Labrum: The superior labrum is significantly thickened and appears
edematous with interstitial tears most notably along the region of
the biceps anchor. I do not see a discrete tear. The anterior and
posterior labrum are grossly intact.

Bones:  No acute bony findings.

Other: Moderate subacromial/subdeltoid bursitis.
IMPRESSION: 1. Significant rotator cuff tendinopathy/tendinosis with
interstitial tears. No discrete full-thickness retracted tear.
2. Significant tendinopathy and probable interstitial tears
involving the long head biceps tendon.
3. Significant thickening and edema like signal changes involving
the superior labrum with interstitial tears most notably along the
region of the biceps anchor.
4. Moderate-sized joint effusion and synovitis.
5. Moderate subacromial/subdeltoid bursitis.

## 2022-07-24 ENCOUNTER — Encounter: Payer: Self-pay | Admitting: *Deleted

## 2023-08-20 ENCOUNTER — Ambulatory Visit (HOSPITAL_BASED_OUTPATIENT_CLINIC_OR_DEPARTMENT_OTHER)
Admission: RE | Admit: 2023-08-20 | Discharge: 2023-08-20 | Disposition: A | Discharge status: Home / Self Care | Source: Ambulatory Visit | Attending: Sports Medicine | Admitting: Sports Medicine

## 2023-08-20 ENCOUNTER — Ambulatory Visit: Payer: Self-pay | Admitting: Sports Medicine

## 2023-08-20 VITALS — BP 120/86 | Ht 64.0 in

## 2023-08-20 DIAGNOSIS — M170 Bilateral primary osteoarthritis of knee: Secondary | ICD-10-CM

## 2023-08-20 MED ORDER — MELOXICAM 15 MG PO TABS
ORAL_TABLET | ORAL | 0 refills | Status: AC
Start: 2023-08-20 — End: ?

## 2023-08-20 NOTE — Progress Notes (Addendum)
   Subjective:    Patient ID: Erica Lindsey, female    DOB: 1960/06/02, 63 y.o.   MRN: [id number]  HPI chief complaint: Bilateral knee pain  Patient is a very pleasant 63 year old female who presents today with bilateral knee pain and swelling.  Previous x-ray of the right knee showed some moderate degenerative changes in the medial compartment.  Those films are not available for my review, only the report.  She has received cortisone injections in the past and has been prescribed meloxicam .  She is currently taking Tylenol  but it is not helping.  She is also getting some instability in both knees.  No recent trauma.  No prior knee surgery.  Past medical history reviewed Medications reviewed Allergies reviewed  Review of Systems As above    Objective:   Physical Exam  Well-developed, well-nourished.  No acute distress  Examination of both knees shows range of motion from 0 to 120 degrees.  Trace effusion bilaterally.  She is tender to palpation along the medial joint lines bilaterally.  No tenderness laterally.  1+ patellofemoral crepitus.  Knees are grossly stable ligamentous exam.  Neurovascularly intact distally.      Assessment & Plan:   Bilateral knee pain and swelling secondary to DJD  Bilateral knee x-rays today.  We discussed treatment options for arthritis including oral NSAIDs, compression sleeves, and cortisone injections.  She would like to start with medication.  We will trial Mobic  15 mg daily with food for 7 days then as needed.  She is cautioned about GI upset with this medication.  She will purchase 2 compression sleeves and wear them during the day when active.  She is instructed not to sleep in them.  She may continue with her Tylenol  as needed and she will follow-up with me again in 3 weeks.  If pain persists at that time then reconsider cortisone injections.  We will also discuss her x-ray results at that visit.  This note was dictated using Dragon naturally  speaking software and may contain errors in syntax, spelling, or content which have not been identified prior to signing this note.   Addendum: X-rays reviewed.  There are moderate tricompartmental degenerative changes in both knees.

## 2023-09-10 ENCOUNTER — Ambulatory Visit: Admitting: Sports Medicine

## 2023-09-10 ENCOUNTER — Encounter: Payer: Self-pay | Admitting: Sports Medicine

## 2023-09-10 VITALS — BP 130/88 | Ht 64.0 in | Wt 204.0 lb

## 2023-09-10 DIAGNOSIS — M170 Bilateral primary osteoarthritis of knee: Secondary | ICD-10-CM | POA: Diagnosis not present

## 2023-09-10 MED ORDER — METHYLPREDNISOLONE ACETATE 40 MG/ML IJ SUSP
40.0000 mg | Freq: Once | INTRAMUSCULAR | Status: AC
  Administered 2023-09-10: 40 mg via INTRA_ARTICULAR

## 2023-09-10 NOTE — Addendum Note (Signed)
 Addended by: Claybon Cuna on: 09/10/2023 09:45 AM   Modules accepted: Orders

## 2023-09-10 NOTE — Progress Notes (Signed)
   Subjective:    Patient ID: Erica Lindsey, female    DOB: 28-Feb-1961, 63 y.o.   MRN: [id number]  HPI Patient presents today for follow-up on bilateral knee pain secondary to osteoarthritis.  Meloxicam  has not been helpful.  She is using Vicks vapor rub which does help somewhat.  She is applying this at night.  Compression sleeve is somewhat uncomfortable.  She does endorse swelling in the right knee.  Also difficulty sleeping at night.   Review of Systems As above    Objective:   Physical Exam  Well-developed, well-nourished.  No acute distress  Right knee: Range of motion is 0 to 100 degrees.  Trace effusion.  Some tenderness to palpation along the medial joint line.  No tenderness along the lateral joint line.  Left knee: Range of motion 0 to 120 degrees.  No effusion.  Slight tenderness to palpation along the medial joint line.  No tenderness along the lateral joint line.  X-rays are as above      Assessment & Plan:   Bilateral knee pain secondary to DJD  We discussed treatment options going forward including continuing with her Vicks vapor rub, Voltaren gel, physical therapy, and cortisone injection.  Patient elects today for a cortisone injection into the right knee.  I have also recommended that she try Voltaren gel during the day since she is using Vicks vapor rub at night.  She will discontinue her meloxicam  since it is not helpful.  She will return to the office in 4 weeks for reevaluation.  At that time we can consider cortisone injection into the left knee as well as revisit the idea of physical therapy which has been helpful for her in the past.  Consent obtained and verified. Time-out conducted. Noted no overlying erythema, induration, or other signs of local infection. Skin prepped in a sterile fashion. Topical analgesic spray: Ethyl chloride. Joint: Right knee, anterior medial approach Needle: 25-gauge 1.5 inch Completed without difficulty. Meds: 3 cc 1%  Xylocaine, 1 cc (40 mg) Depo-Medrol  This note was dictated using Dragon naturally speaking software and may contain errors in syntax, spelling, or content which have not been identified prior to signing this note.

## 2023-09-21 ENCOUNTER — Other Ambulatory Visit: Payer: Self-pay | Admitting: Sports Medicine

## 2023-09-21 DIAGNOSIS — M170 Bilateral primary osteoarthritis of knee: Secondary | ICD-10-CM

## 2023-10-08 ENCOUNTER — Ambulatory Visit: Admitting: Sports Medicine

## 2023-10-15 ENCOUNTER — Ambulatory Visit: Admitting: Sports Medicine

## 2023-10-15 ENCOUNTER — Encounter: Payer: Self-pay | Admitting: Sports Medicine

## 2023-10-15 VITALS — BP 116/72 | Ht 64.0 in | Wt 204.0 lb

## 2023-10-15 DIAGNOSIS — M170 Bilateral primary osteoarthritis of knee: Secondary | ICD-10-CM | POA: Diagnosis not present

## 2023-10-15 NOTE — Progress Notes (Signed)
 Patient ID: Erica Lindsey, female   DOB: 01-24-1961, 63 y.o.   MRN: [id number]  Patient presents today for follow-up on knee pain secondary to osteoarthritis.  She still has pain with activity.  It does respond to Voltaren gel.  Cortisone injection in the right knee was somewhat helpful.  X-rays of both knees done on May 14 showed moderate degenerative changes in both knees.  Physical examination was not repeated today.  We simply talked about treatment options going forward.  We had previously discussed formal physical therapy as that has been helpful in the past, but she is not yet interested in trying this.  I have instead given her 3 home exercises to try (isometric quad, isometric hip abductor, and wall squats ).  She will avoid any exercise that causes pain.  She will continue with her Voltaren gel as well.  If she changes her mind about physical therapy, she will contact my office and we will make that referral for her.  Otherwise, follow-up as needed.  This note was dictated using Dragon naturally speaking software and may contain errors in syntax, spelling, or content which have not been identified prior to signing this note.

## 2023-12-31 ENCOUNTER — Ambulatory Visit: Admitting: Sports Medicine
